# Patient Record
Sex: Female | Born: 1955 | Marital: Married | Smoking: Never smoker
Health system: Southern US, Community
[De-identification: ages and names within clinical notes are randomized; demographics above are authoritative.]

## PROBLEM LIST (undated history)

## (undated) DIAGNOSIS — I10 Essential (primary) hypertension: Secondary | ICD-10-CM

## (undated) DIAGNOSIS — E119 Type 2 diabetes mellitus without complications: Secondary | ICD-10-CM

## (undated) HISTORY — PX: COLONOSCOPY: SHX174

---

## 2019-05-24 ENCOUNTER — Emergency Department (HOSPITAL_COMMUNITY): Payer: No Typology Code available for payment source

## 2019-05-24 ENCOUNTER — Encounter (HOSPITAL_COMMUNITY)
Admission: EM | Disposition: A | Discharge status: Home / Self Care | Payer: Self-pay | Source: Home / Self Care | Attending: Emergency Medicine

## 2019-05-24 ENCOUNTER — Emergency Department (HOSPITAL_COMMUNITY): Payer: No Typology Code available for payment source | Admitting: Certified Registered Nurse Anesthetist

## 2019-05-24 ENCOUNTER — Encounter (HOSPITAL_COMMUNITY): Payer: Self-pay | Admitting: Emergency Medicine

## 2019-05-24 ENCOUNTER — Other Ambulatory Visit: Payer: Self-pay

## 2019-05-24 ENCOUNTER — Observation Stay (HOSPITAL_COMMUNITY)
Admission: EM | Admit: 2019-05-24 | Discharge: 2019-05-25 | Disposition: A | Discharge status: Home / Self Care | Payer: No Typology Code available for payment source | Attending: Urology | Admitting: Urology

## 2019-05-24 DIAGNOSIS — N200 Calculus of kidney: Secondary | ICD-10-CM

## 2019-05-24 DIAGNOSIS — N135 Crossing vessel and stricture of ureter without hydronephrosis: Secondary | ICD-10-CM | POA: Diagnosis present

## 2019-05-24 DIAGNOSIS — Z90722 Acquired absence of ovaries, bilateral: Secondary | ICD-10-CM | POA: Diagnosis not present

## 2019-05-24 DIAGNOSIS — E119 Type 2 diabetes mellitus without complications: Secondary | ICD-10-CM | POA: Diagnosis not present

## 2019-05-24 DIAGNOSIS — N201 Calculus of ureter: Principal | ICD-10-CM | POA: Insufficient documentation

## 2019-05-24 DIAGNOSIS — I700 Atherosclerosis of aorta: Secondary | ICD-10-CM | POA: Insufficient documentation

## 2019-05-24 DIAGNOSIS — Z20822 Contact with and (suspected) exposure to covid-19: Secondary | ICD-10-CM | POA: Diagnosis not present

## 2019-05-24 DIAGNOSIS — N136 Pyonephrosis: Secondary | ICD-10-CM | POA: Insufficient documentation

## 2019-05-24 DIAGNOSIS — N12 Tubulo-interstitial nephritis, not specified as acute or chronic: Secondary | ICD-10-CM | POA: Diagnosis present

## 2019-05-24 HISTORY — PX: CYSTOSCOPY WITH RETROGRADE PYELOGRAM, URETEROSCOPY AND STENT PLACEMENT: SHX5789

## 2019-05-24 HISTORY — DX: Type 2 diabetes mellitus without complications: E11.9

## 2019-05-24 LAB — CBC
HCT: 41 % (ref 36.0–46.0)
Hemoglobin: 13.5 g/dL (ref 12.0–15.0)
MCH: 28.5 pg (ref 26.0–34.0)
MCHC: 32.9 g/dL (ref 30.0–36.0)
MCV: 86.7 fL (ref 80.0–100.0)
Platelets: 308 10*3/uL (ref 150–400)
RBC: 4.73 MIL/uL (ref 3.87–5.11)
RDW: 13.7 % (ref 11.5–15.5)
WBC: 12 10*3/uL — ABNORMAL HIGH (ref 4.0–10.5)
nRBC: 0 % (ref 0.0–0.2)

## 2019-05-24 LAB — URINALYSIS, ROUTINE W REFLEX MICROSCOPIC
Bilirubin Urine: NEGATIVE
Glucose, UA: NEGATIVE mg/dL
Hgb urine dipstick: NEGATIVE
Ketones, ur: 5 mg/dL — AB
Nitrite: POSITIVE — AB
Protein, ur: NEGATIVE mg/dL
Specific Gravity, Urine: 1.012 (ref 1.005–1.030)
WBC, UA: >50 WBC/hpf — ABNORMAL HIGH (ref 0–5)
pH: 5 (ref 5.0–8.0)

## 2019-05-24 LAB — COMPREHENSIVE METABOLIC PANEL
ALT: 30 U/L (ref 0–44)
AST: 19 U/L (ref 15–41)
Albumin: 3.2 g/dL — ABNORMAL LOW (ref 3.5–5.0)
Alkaline Phosphatase: 63 U/L (ref 38–126)
Anion gap: 13 (ref 5–15)
BUN: 20 mg/dL (ref 8–23)
CO2: 21 mmol/L — ABNORMAL LOW (ref 22–32)
Calcium: 9.2 mg/dL (ref 8.9–10.3)
Chloride: 102 mmol/L (ref 98–111)
Creatinine, Ser: 0.86 mg/dL (ref 0.44–1.00)
GFR calc Af Amer: >60 mL/min (ref >60)
GFR calc non Af Amer: >60 mL/min (ref >60)
Glucose, Bld: 140 mg/dL — ABNORMAL HIGH (ref 70–99)
Potassium: 3.8 mmol/L (ref 3.5–5.1)
Sodium: 136 mmol/L (ref 135–145)
Total Bilirubin: 1.1 mg/dL (ref 0.3–1.2)
Total Protein: 7.5 g/dL (ref 6.5–8.1)

## 2019-05-24 LAB — RESPIRATORY PANEL BY RT PCR (FLU A&B, COVID)
Influenza A by PCR: NEGATIVE
Influenza B by PCR: NEGATIVE
SARS Coronavirus 2 by RT PCR: NEGATIVE

## 2019-05-24 LAB — GLUCOSE, CAPILLARY
Glucose-Capillary: 85 mg/dL (ref 70–99)
Glucose-Capillary: 102 mg/dL — ABNORMAL HIGH (ref 70–99)

## 2019-05-24 LAB — LIPASE, BLOOD: Lipase: 25 U/L (ref 11–51)

## 2019-05-24 SURGERY — CYSTOURETEROSCOPY, WITH RETROGRADE PYELOGRAM AND STENT INSERTION
Anesthesia: General | Site: Ureter | Laterality: Bilateral

## 2019-05-24 MED ORDER — SODIUM CHLORIDE 0.9 % IR SOLN
Status: DC | PRN
  Administered 2019-05-24: 3000 mL

## 2019-05-24 MED ORDER — IOHEXOL 300 MG/ML  SOLN
INTRAMUSCULAR | Status: DC | PRN
  Administered 2019-05-24: 50 mL

## 2019-05-24 MED ORDER — DEXAMETHASONE SODIUM PHOSPHATE 10 MG/ML IJ SOLN
INTRAMUSCULAR | Status: AC
  Filled 2019-05-24: qty 1

## 2019-05-24 MED ORDER — ZOLPIDEM TARTRATE 5 MG PO TABS: 5.0000 mg | ORAL_TABLET | Freq: Every evening | ORAL | Status: DC | PRN

## 2019-05-24 MED ORDER — SODIUM CHLORIDE 0.9 % IV SOLN: INTRAVENOUS | Status: DC

## 2019-05-24 MED ORDER — SODIUM CHLORIDE 0.9 % IV SOLN
1.0000 g | Freq: Once | INTRAVENOUS | Status: AC
  Administered 2019-05-24: 1 g via INTRAVENOUS
  Filled 2019-05-24: qty 10

## 2019-05-24 MED ORDER — PROPOFOL 10 MG/ML IV BOLUS
INTRAVENOUS | Status: DC | PRN
  Administered 2019-05-24: 150 mg via INTRAVENOUS

## 2019-05-24 MED ORDER — ONDANSETRON HCL 4 MG/2ML IJ SOLN: 4.0000 mg | INTRAMUSCULAR | Status: DC | PRN

## 2019-05-24 MED ORDER — FENTANYL CITRATE (PF) 100 MCG/2ML IJ SOLN: 25.0000 ug | INTRAMUSCULAR | Status: DC | PRN

## 2019-05-24 MED ORDER — ACETAMINOPHEN 325 MG PO TABS: 650.0000 mg | ORAL_TABLET | ORAL | Status: DC | PRN

## 2019-05-24 MED ORDER — MIDAZOLAM HCL 5 MG/5ML IJ SOLN
INTRAMUSCULAR | Status: DC | PRN
  Administered 2019-05-24: 2 mg via INTRAVENOUS

## 2019-05-24 MED ORDER — MIDAZOLAM HCL 2 MG/2ML IJ SOLN
INTRAMUSCULAR | Status: AC
  Filled 2019-05-24: qty 2

## 2019-05-24 MED ORDER — DEXAMETHASONE SODIUM PHOSPHATE 10 MG/ML IJ SOLN
INTRAMUSCULAR | Status: DC | PRN
  Administered 2019-05-24: 4 mg via INTRAVENOUS

## 2019-05-24 MED ORDER — DIPHENHYDRAMINE HCL 50 MG/ML IJ SOLN
INTRAMUSCULAR | Status: DC | PRN
  Administered 2019-05-24: 12.5 mg via INTRAVENOUS

## 2019-05-24 MED ORDER — IOHEXOL 300 MG/ML  SOLN
100.0000 mL | Freq: Once | INTRAMUSCULAR | Status: AC | PRN
  Administered 2019-05-24: 100 mL via INTRAVENOUS

## 2019-05-24 MED ORDER — SODIUM CHLORIDE 0.9 % IV BOLUS
500.0000 mL | Freq: Once | INTRAVENOUS | Status: AC
  Administered 2019-05-24: 500 mL via INTRAVENOUS

## 2019-05-24 MED ORDER — PROPOFOL 10 MG/ML IV BOLUS
INTRAVENOUS | Status: AC
  Filled 2019-05-24: qty 20

## 2019-05-24 MED ORDER — ONDANSETRON HCL 4 MG/2ML IJ SOLN
INTRAMUSCULAR | Status: DC | PRN
  Administered 2019-05-24: 4 mg via INTRAVENOUS

## 2019-05-24 MED ORDER — DIPHENHYDRAMINE HCL 50 MG/ML IJ SOLN: 12.5000 mg | Freq: Four times a day (QID) | INTRAMUSCULAR | Status: DC | PRN

## 2019-05-24 MED ORDER — LIDOCAINE 2% (20 MG/ML) 5 ML SYRINGE
INTRAMUSCULAR | Status: DC | PRN
  Administered 2019-05-24: 80 mg via INTRAVENOUS

## 2019-05-24 MED ORDER — ACETAMINOPHEN 500 MG PO TABS
1000.0000 mg | ORAL_TABLET | Freq: Once | ORAL | Status: AC
  Administered 2019-05-24: 1000 mg via ORAL

## 2019-05-24 MED ORDER — INSULIN ASPART 100 UNIT/ML ~~LOC~~ SOLN
0.0000 [IU] | Freq: Three times a day (TID) | SUBCUTANEOUS | Status: DC
  Administered 2019-05-25: 14:00:00 11 [IU] via SUBCUTANEOUS
  Administered 2019-05-25: 8 [IU] via SUBCUTANEOUS

## 2019-05-24 MED ORDER — INSULIN ASPART 100 UNIT/ML ~~LOC~~ SOLN
4.0000 [IU] | Freq: Three times a day (TID) | SUBCUTANEOUS | Status: DC
  Administered 2019-05-25 (×2): 4 [IU] via SUBCUTANEOUS

## 2019-05-24 MED ORDER — SODIUM CHLORIDE 0.9% FLUSH
3.0000 mL | Freq: Once | INTRAVENOUS | Status: AC
  Administered 2019-05-24: 11:00:00 3 mL via INTRAVENOUS

## 2019-05-24 MED ORDER — PROMETHAZINE HCL 25 MG/ML IJ SOLN: 6.2500 mg | INTRAMUSCULAR | Status: DC | PRN

## 2019-05-24 MED ORDER — HYDROMORPHONE HCL 1 MG/ML IJ SOLN: 0.5000 mg | INTRAMUSCULAR | Status: DC | PRN

## 2019-05-24 MED ORDER — OXYCODONE-ACETAMINOPHEN 5-325 MG PO TABS
1.0000 | ORAL_TABLET | ORAL | Status: DC | PRN
  Administered 2019-05-25 (×2): 1 via ORAL
  Filled 2019-05-24 (×2): qty 1

## 2019-05-24 MED ORDER — SODIUM CHLORIDE 0.9 % IR SOLN
Status: DC | PRN
  Administered 2019-05-24: 1000 mL

## 2019-05-24 MED ORDER — FENTANYL CITRATE (PF) 100 MCG/2ML IJ SOLN
INTRAMUSCULAR | Status: AC
  Filled 2019-05-24: qty 2

## 2019-05-24 MED ORDER — LIDOCAINE 2% (20 MG/ML) 5 ML SYRINGE
INTRAMUSCULAR | Status: AC
  Filled 2019-05-24: qty 5

## 2019-05-24 MED ORDER — LACTATED RINGERS IV SOLN: INTRAVENOUS | Status: DC | PRN

## 2019-05-24 MED ORDER — SODIUM CHLORIDE 0.9 % IV SOLN
1.0000 g | INTRAVENOUS | Status: DC
  Administered 2019-05-25: 1 g via INTRAVENOUS
  Filled 2019-05-24: qty 1

## 2019-05-24 MED ORDER — DIPHENHYDRAMINE HCL 12.5 MG/5ML PO ELIX
12.5000 mg | ORAL_SOLUTION | Freq: Four times a day (QID) | ORAL | Status: DC | PRN
  Administered 2019-05-25: 12.5 mg via ORAL
  Filled 2019-05-24: qty 5

## 2019-05-24 MED ORDER — ONDANSETRON HCL 4 MG/2ML IJ SOLN
INTRAMUSCULAR | Status: AC
  Filled 2019-05-24: qty 2

## 2019-05-24 MED ORDER — DIPHENHYDRAMINE HCL 50 MG/ML IJ SOLN
INTRAMUSCULAR | Status: AC
  Filled 2019-05-24: qty 1

## 2019-05-24 MED ORDER — HYOSCYAMINE SULFATE 0.125 MG SL SUBL
0.1250 mg | SUBLINGUAL_TABLET | SUBLINGUAL | Status: DC | PRN
  Filled 2019-05-24: qty 1

## 2019-05-24 MED ORDER — INSULIN ASPART 100 UNIT/ML ~~LOC~~ SOLN
0.0000 [IU] | Freq: Every day | SUBCUTANEOUS | Status: DC
  Administered 2019-05-25: 4 [IU] via SUBCUTANEOUS

## 2019-05-24 MED ORDER — FENTANYL CITRATE (PF) 100 MCG/2ML IJ SOLN
INTRAMUSCULAR | Status: DC | PRN
  Administered 2019-05-24 (×2): 50 ug via INTRAVENOUS

## 2019-05-24 SURGICAL SUPPLY — 21 items
BAG URO CATCHER STRL LF (MISCELLANEOUS) ×3 IMPLANT
CATH INTERMIT  6FR 70CM (CATHETERS) ×3 IMPLANT
CLOTH BEACON ORANGE TIMEOUT ST (SAFETY) ×3 IMPLANT
EXTRACTOR STONE NITINOL NGAGE (UROLOGICAL SUPPLIES) IMPLANT
FIBER LASER TRAC TIP (UROLOGICAL SUPPLIES) IMPLANT
GLOVE BIO SURGEON STRL SZ8 (GLOVE) ×3 IMPLANT
GOWN STRL REUS W/TWL XL LVL3 (GOWN DISPOSABLE) ×3 IMPLANT
GUIDEWIRE ANG ZIPWIRE 038X150 (WIRE) IMPLANT
GUIDEWIRE STR DUAL SENSOR (WIRE) ×3 IMPLANT
IV NS 1000ML (IV SOLUTION) ×2
IV NS 1000ML BAXH (IV SOLUTION) ×1 IMPLANT
KIT TURNOVER KIT A (KITS) IMPLANT
MANIFOLD NEPTUNE II (INSTRUMENTS) ×3 IMPLANT
PACK CYSTO (CUSTOM PROCEDURE TRAY) ×3 IMPLANT
SHEATH URETERAL 12FRX35CM (MISCELLANEOUS) IMPLANT
STENT URET 6FRX24 CONTOUR (STENTS) ×6 IMPLANT
STENT URET 6FRX26 CONTOUR (STENTS) IMPLANT
TUBE FEEDING 8FR 16IN STR KANG (MISCELLANEOUS) IMPLANT
TUBING CONNECTING 10 (TUBING) ×2 IMPLANT
TUBING CONNECTING 10' (TUBING) ×1
TUBING UROLOGY SET (TUBING) IMPLANT

## 2019-05-24 NOTE — Anesthesia Procedure Notes (Signed)
Procedure Name: LMA Insertion Date/Time: 05/24/2019 4:05 PM Performed by: Epimenio Sarin, CRNA Pre-anesthesia Checklist: Patient identified, Emergency Drugs available, Suction available, Patient being monitored and Timeout performed Patient Re-evaluated:Patient Re-evaluated prior to induction Oxygen Delivery Method: Circle system utilized Preoxygenation: Pre-oxygenation with 100% oxygen Induction Type: IV induction LMA: LMA with gastric port inserted LMA Size: 4.0 Number of attempts: 1 Dental Injury: Teeth and Oropharynx as per pre-operative assessment

## 2019-05-24 NOTE — ED Notes (Signed)
Per ED Provider, Pt to remain strictly NPO

## 2019-05-24 NOTE — Transfer of Care (Signed)
Immediate Anesthesia Transfer of Care Note  Patient: Yolanda Olsen  Procedure(s) Performed: CYSTOSCOPY WITH RETROGRADE PYELOGRAM,  AND STENT PLACEMENT (Bilateral Ureter)  Patient Location: PACU  Anesthesia Type:General  Level of Consciousness: awake  Airway & Oxygen Therapy: Patient Spontanous Breathing and Patient connected to face mask oxygen  Post-op Assessment: Report given to RN and Post -op Vital signs reviewed and stable  Post vital signs: Reviewed and stable  Last Vitals:  Vitals Value Taken Time  BP 125/73 05/24/19 1649  Temp    Pulse 103 05/24/19 1650  Resp 15 05/24/19 1650  SpO2 100 % 05/24/19 1650  Vitals shown include unvalidated device data.  Last Pain:  Vitals:   05/24/19 1127  TempSrc:   PainSc: 8          Complications: No apparent anesthesia complications

## 2019-05-24 NOTE — ED Triage Notes (Signed)
C/o LLQ and L side pain since Monday.  Denies nausea, vomiting, diarrhea, or urinary complaints.

## 2019-05-24 NOTE — H&P (Signed)
Urology H and P  HPI: Yolanda Olsen is a 64 year old female with history of well-controlled diabetes who presented to the ED with left-sided abdominal pain.  Pain been present for 6 days.  Upon evaluation she was afebrile and slightly tachycardic to 109.  Normotensive.  CT showed bilateral ureteral obstruction with hydronephrosis.  4 mm stone on the left, 2 mm stone on the right.  Stranding consistent with pyelonephritis.  UA showed positive leukocyte esterase, positive nitrite, few bacteria.  Greater than 50 WBC.  Lab work with leukocytosis to 12, normal creatinine at 0.86.  No metabolic abnormalities. Covid test was performed and is pending.  Current symptoms include left > right flank pain   She has one prior epidosde of nephrolithiasis that passed spontaneously. She does not have a urologist. She is not on anticoagulation.   Past Medical History: Past Medical History:  Diagnosis Date  . Diabetes mellitus without complication [facility])     Past Surgical History:  History reviewed. No pertinent surgical history.  Medication: Current Facility-Administered Medications  Medication Dose Route Frequency Provider Last Rate Last Admin  . cefTRIAXone (ROCEPHIN) 1 g in sodium chloride 0.9 % 100 mL IVPB  1 g Intravenous Once Benjiman Core, MD 200 mL/hr at 05/24/19 1303 1 g at 05/24/19 1303   No current outpatient medications on file.    Allergies: Not on File  Social History: Social History   Tobacco Use  . Smoking status: Never Smoker  . Smokeless tobacco: Never Used  Substance Use Topics  . Alcohol use: Not Currently  . Drug use: Never    Family History No family history on file.  Review of Systems 10 systems were reviewed and are negative except as noted specifically in the HPI.  Objective   Vital signs in last 24 hours: BP 140/80   Pulse 93   Temp 97.9 F (36.6 C) (Oral)   Resp 18   Ht 5\' 2"  (1.575 m)   Wt 61.2 kg   SpO2 95%   BMI 24.69 kg/m   Physical  Exam General Appearance:  No acute distress. Alert and oriented x 3. Pulmonary: Normal respiratory effort on room air Cardiovascular: Regular rate Abdomen: Soft, non-tender, without masses. Musculoskeletal: Normal gait. Extremities without edema. GU: Minimal left CVA tenderness Neurologic:  No motor abnormalities noted.    Most Recent Labs: Lab Results  Component Value Date   WBC 12.0 (H) 05/24/2019   HGB 13.5 05/24/2019   HCT 41.0 05/24/2019   PLT 308 05/24/2019    Lab Results  Component Value Date   NA 136 05/24/2019   K 3.8 05/24/2019   CL 102 05/24/2019   CO2 21 (L) 05/24/2019   BUN 20 05/24/2019   CREATININE 0.86 05/24/2019   CALCIUM 9.2 05/24/2019    No results found for: INR, APTT   IMAGING: CT ABDOMEN PELVIS W CONTRAST  Result Date: 05/24/2019 CLINICAL DATA:  Left lower quadrant pain for 6 days. Elevated white blood cell count. EXAM: CT ABDOMEN AND PELVIS WITH CONTRAST TECHNIQUE: Multidetector CT imaging of the abdomen and pelvis was performed using the standard protocol following bolus administration of intravenous contrast. CONTRAST:  100 mL OMNIPAQUE IOHEXOL 300 MG/ML  SOLN COMPARISON:  None. FINDINGS: Lower chest: Mild dependent atelectasis. No pleural or pericardial effusion. Hepatobiliary: No focal liver abnormality is seen. No gallstones, gallbladder wall thickening, or biliary dilatation. Pancreas: Unremarkable. No pancreatic ductal dilatation or surrounding inflammatory changes. Spleen: Normal in size without focal abnormality. Adrenals/Urinary Tract: The adrenal glands appear  normal. The patient has moderate to moderately severe bilateral hydronephrosis which is worse on the left. There is a distal right ureteral stone measuring 0.4 cm on image 66 and a distal left ureteral stone on image 68 which measures 0.2 cm. Patchy areas decreased cortical enhancement on delayed imaging are seen in both kidneys, much more extensive on the left, compatible with  pyelonephritis. The patient has 2 small right and a single small left renal cyst. Urinary bladder is unremarkable. Stomach/Bowel: Stomach is within normal limits. Appendix appears normal. No evidence of bowel wall thickening, distention, or inflammatory changes. Vascular/Lymphatic: Aortic atherosclerosis. No enlarged abdominal or pelvic lymph nodes. Reproductive: Status post hysterectomy. No adnexal masses. Other: None. Musculoskeletal: No acute or focal bony abnormality. Lower lumbar degenerative change noted. IMPRESSION: Moderately to moderately severe bilateral hydronephrosis due to distal ureteral stones is worse on the left. Left ureteral stone measures 0.2 cm and right renal stone measures 0.4 cm. Multifocal areas of decreased cortical enhancement the kidneys compatible with pyelonephritis are much more extensive in the left kidney. No abscess. Atherosclerosis. Electronically Signed   By: Inge Rise M.D.   On: 05/24/2019 12:20    ------  Assessment:  65 y.o. female with bilateral ureteral stones and evidence of infection.  Due to both of these factors she needs urgent ureteral stent placement.  Discussed risks of stent placement which include temporary worsening of symptoms, stent discomfort, need for second surgery, hematuria.  Alternatives would be no stent placement but this puts her at risk of infection and AKI.  Recommendations: -Bilateral ureteral stent placement -Ceftriaxone in the ED, will continue -Urine culture pending -Admit to urology at [facility] postoperatively

## 2019-05-24 NOTE — Anesthesia Preprocedure Evaluation (Signed)
Anesthesia Evaluation  Patient identified by MRN, date of birth, ID band Patient awake    Reviewed: Allergy & Precautions, NPO status , Patient's Chart, lab work & pertinent test results  Airway Mallampati: II  TM Distance: >3 FB Neck ROM: Full    Dental  (+) Teeth Intact, Dental Advisory Given   Pulmonary neg pulmonary ROS,    Pulmonary exam normal breath sounds clear to auscultation       Cardiovascular negative cardio ROS   Rhythm:Regular Rate:Tachycardia     Neuro/Psych negative neurological ROS     GI/Hepatic negative GI ROS, Neg liver ROS,   Endo/Other  diabetes, Type 2  Renal/GU bilateral ureteral obstruction     Musculoskeletal negative musculoskeletal ROS (+)   Abdominal   Peds  Hematology negative hematology ROS (+)   Anesthesia Other Findings Day of surgery medications reviewed with the patient.  Reproductive/Obstetrics                             Anesthesia Physical Anesthesia Plan  ASA: II and emergent  Anesthesia Plan: General   Post-op Pain Management:    Induction: Intravenous  PONV Risk Score and Plan: 4 or greater and Midazolam, Dexamethasone, Ondansetron and Diphenhydramine  Airway Management Planned: Oral ETT  Additional Equipment:   Intra-op Plan:   Post-operative Plan: Extubation in OR  Informed Consent: I have reviewed the patients History and Physical, chart, labs and discussed the procedure including the risks, benefits and alternatives for the proposed anesthesia with the patient or authorized representative who has indicated his/her understanding and acceptance.     Dental advisory given  Plan Discussed with: CRNA  Anesthesia Plan Comments:         Anesthesia Quick Evaluation

## 2019-05-24 NOTE — Anesthesia Postprocedure Evaluation (Signed)
Anesthesia Post Note  Patient: Chief Financial Officer  Procedure(s) Performed: CYSTOSCOPY WITH RETROGRADE PYELOGRAM,  AND STENT PLACEMENT (Bilateral Ureter)     Patient location during evaluation: PACU Anesthesia Type: General Level of consciousness: awake and alert Pain management: pain level controlled Vital Signs Assessment: post-procedure vital signs reviewed and stable Respiratory status: spontaneous breathing, nonlabored ventilation and respiratory function stable Cardiovascular status: blood pressure returned to baseline and stable Postop Assessment: no apparent nausea or vomiting Anesthetic complications: no    Last Vitals:  Vitals:   05/24/19 1745 05/24/19 1800  BP: 107/65 104/74  Pulse: 86 85  Resp: 18 20  Temp:  37.1 C  SpO2: 93% 93%    Last Pain:  Vitals:   05/24/19 1800  TempSrc:   PainSc: 0-No pain                 Cecile Hearing

## 2019-05-24 NOTE — Op Note (Addendum)
Preoperative diagnosis: bilateral ureteral calculi  Postoperative diagnosis: Same  Procedure: 1 cystoscopy 2. Bilateral retrograde pyelography 3.  Intraoperative fluoroscopy, under one hour, with interpretation 4.  bilateral 6 x 26 JJ stent exchange  Attending: Wilkie Aye, MD  Resident: Federico Flake, MD  Anesthesia: General  Estimated blood loss: None  Drains: bilateral 6 x 24 JJ ureteral stent without tether  Specimens: none  Antibiotics: rocephin  Findings: bilateral ureteral stones. moderate bilateral hydronephrosis. No masses/lesions in the bladder. Ureteral orifices in normal anatomic location.  Indications: Patient is a 64 year old female with a history of bilateral ureteral stone and concern for sepsis. After discussing treatment options, they decided proceed with bilateral stent placement.  Procedure her in detail: The patient was brought to the operating room and a brief timeout was done to ensure correct patient, correct procedure, correct site.  General anesthesia was administered patient was placed in dorsal lithotomy position.  Her genitalia was then prepped and draped in usual sterile fashion.  A rigid 22 French cystoscope was passed in the urethra and the bladder.  Bladder was inspected free masses or lesions.  the ureteral orifices were in the normal orthotopic locations. a 6 french ureteral catheter was then instilled into the left ureteral orifice.  a gentle retrograde was obtained and findings noted above. We then advanced a zipwire up to the renal pelvis. We then placed a 6 x 26 double-j ureteral stent over the zip wire. We then removed the wire and good coil was noted in the the renal pelvis under fluoroscopy and the bladder under direct vision.  We then turned out attention to the right side.  a gentle retrograde was obtained and findings noted above. We then advanced a zipwire up to the renal pelvis. we then placed a 6 x 26 double-j ureteral stent over the  original zip wire.  We then removed the wire and good coil was noted in the the renal pelvis under fluoroscopy and the bladder under direct vision.  the bladder was then drained and this concluded the procedure which was well tolerated by patient.  Complications: None  Condition: Stable, extubated, transferred to PACU  Plan: Patient is to be admitted for IV antibiotics

## 2019-05-24 NOTE — ED Provider Notes (Signed)
[facility] EMERGENCY DEPARTMENT Provider Note   CSN: [id number] Arrival date & time: 05/24/19  1017     History Chief Complaint  Patient presents with  . Abdominal Pain    Yolanda Olsen is a 64 y.o. female.  HPI Patient presents with left-sided abdominal pain.  Began around 6 days ago.  States gotten worse.  States she is decreased eating because of it.  States when she eats the pain gets more intense.  No nausea vomiting diarrhea constipation.  No fevers.  Has not had pains like this before.  No sick contacts.  No vaginal bleeding or discharge.  States she has previously had her ovaries removed.    Past Medical History:  Diagnosis Date  . Diabetes mellitus without complication (HCC)     There are no problems to display for this patient.   History reviewed. No pertinent surgical history.   OB History   No obstetric history on file.     No family history on file.  Social History   Tobacco Use  . Smoking status: Never Smoker  . Smokeless tobacco: Never Used  Substance Use Topics  . Alcohol use: Not Currently  . Drug use: Never    Home Medications Prior to Admission medications   Not on File    Allergies    Patient has no allergy information on record.  Review of Systems   Review of Systems  Constitutional: Negative for appetite change.  HENT: Negative for congestion.   Respiratory: Negative for shortness of breath.   Cardiovascular: Negative for chest pain.  Gastrointestinal: Positive for abdominal pain.  Genitourinary: Positive for flank pain. Negative for dysuria.  Musculoskeletal: Negative for back pain.  Skin: Negative for rash.  Neurological: Negative for weakness.  Psychiatric/Behavioral: Negative for confusion.    Physical Exam Updated Vital Signs BP 140/75   Pulse 90   Temp 97.9 F (36.6 C) (Oral)   Resp 17   Ht 5\' 2"  (1.575 m)   Wt 61.2 kg   SpO2 97%   BMI 24.69 kg/m   Physical Exam Vitals and nursing note  reviewed.  HENT:     Head: Normocephalic.  Cardiovascular:     Rate and Rhythm: Tachycardia present.     Comments: Mild tachycardia Pulmonary:     Breath sounds: Normal breath sounds.  Abdominal:     Comments: Left mid abdominal tenderness without rebound or guarding.  No hernia palpated.  Skin:    General: Skin is warm.     Capillary Refill: Capillary refill takes less than 2 seconds.  Neurological:     Mental Status: She is alert.     Comments: Patient is awake and appropriate     ED Results / Procedures / Treatments   Labs (all labs ordered are listed, but only abnormal results are displayed) Labs Reviewed  COMPREHENSIVE METABOLIC PANEL - Abnormal; Notable for the following components:      Result Value   CO2 21 (*)    Glucose, Bld 140 (*)    Albumin 3.2 (*)    All other components within normal limits  CBC - Abnormal; Notable for the following components:   WBC 12.0 (*)    All other components within normal limits  URINALYSIS, ROUTINE W REFLEX MICROSCOPIC - Abnormal; Notable for the following components:   APPearance HAZY (*)    Ketones, ur 5 (*)    Nitrite POSITIVE (*)    Leukocytes,Ua MODERATE (*)    WBC, UA >50 (*)  Bacteria, UA FEW (*)    All other components within normal limits  RESPIRATORY PANEL BY RT PCR (FLU A&B, COVID)  URINE CULTURE  LIPASE, BLOOD    EKG None  Radiology CT ABDOMEN PELVIS W CONTRAST  Result Date: 05/24/2019 CLINICAL DATA:  Left lower quadrant pain for 6 days. Elevated white blood cell count. EXAM: CT ABDOMEN AND PELVIS WITH CONTRAST TECHNIQUE: Multidetector CT imaging of the abdomen and pelvis was performed using the standard protocol following bolus administration of intravenous contrast. CONTRAST:  100 mL OMNIPAQUE IOHEXOL 300 MG/ML  SOLN COMPARISON:  None. FINDINGS: Lower chest: Mild dependent atelectasis. No pleural or pericardial effusion. Hepatobiliary: No focal liver abnormality is seen. No gallstones, gallbladder wall  thickening, or biliary dilatation. Pancreas: Unremarkable. No pancreatic ductal dilatation or surrounding inflammatory changes. Spleen: Normal in size without focal abnormality. Adrenals/Urinary Tract: The adrenal glands appear normal. The patient has moderate to moderately severe bilateral hydronephrosis which is worse on the left. There is a distal right ureteral stone measuring 0.4 cm on image 66 and a distal left ureteral stone on image 68 which measures 0.2 cm. Patchy areas decreased cortical enhancement on delayed imaging are seen in both kidneys, much more extensive on the left, compatible with pyelonephritis. The patient has 2 small right and a single small left renal cyst. Urinary bladder is unremarkable. Stomach/Bowel: Stomach is within normal limits. Appendix appears normal. No evidence of bowel wall thickening, distention, or inflammatory changes. Vascular/Lymphatic: Aortic atherosclerosis. No enlarged abdominal or pelvic lymph nodes. Reproductive: Status post hysterectomy. No adnexal masses. Other: None. Musculoskeletal: No acute or focal bony abnormality. Lower lumbar degenerative change noted. IMPRESSION: Moderately to moderately severe bilateral hydronephrosis due to distal ureteral stones is worse on the left. Left ureteral stone measures 0.2 cm and right renal stone measures 0.4 cm. Multifocal areas of decreased cortical enhancement the kidneys compatible with pyelonephritis are much more extensive in the left kidney. No abscess. Atherosclerosis. Electronically Signed   By: Inge Rise M.D.   On: 05/24/2019 12:20    Procedures Procedures (including critical care time)  Medications Ordered in ED Medications  sodium chloride flush (NS) 0.9 % injection 3 mL (3 mLs Intravenous Given 05/24/19 1126)  sodium chloride 0.9 % bolus 500 mL (0 mLs Intravenous Stopped 05/24/19 1236)  iohexol (OMNIPAQUE) 300 MG/ML solution 100 mL (100 mLs Intravenous Contrast Given 05/24/19 1154)  cefTRIAXone  (ROCEPHIN) 1 g in sodium chloride 0.9 % 100 mL IVPB (1 g Intravenous New Bag/Given 05/24/19 1303)    ED Course  I have reviewed the triage vital signs and the nursing notes.  Pertinent labs & imaging results that were available during my care of the patient were reviewed by me and considered in my medical decision making (see chart for details).    MDM Rules/Calculators/A&P                      Patient with left flank pain.  Well-appearing but found to have bilateral hydronephrosis from kidney stones and apparent UTI with Pyelo.  Discussed with nephrology.  Will take to the [facility] at [facility].  Dr. Nicolette Bang excepting. Final Clinical Impression(s) / ED Diagnoses Final diagnoses:  Bilateral kidney stones  Pyelonephritis    Rx / DC Orders ED Discharge Orders    None       Davonna Belling, MD 05/24/19 1414

## 2019-05-24 NOTE — ED Notes (Signed)
Patient transported to CT 

## 2019-05-24 NOTE — ED Notes (Signed)
Report given to [facility]. 

## 2019-05-25 LAB — CBC
HCT: 36.7 % (ref 36.0–46.0)
Hemoglobin: 11.7 g/dL — ABNORMAL LOW (ref 12.0–15.0)
MCH: 28 pg (ref 26.0–34.0)
MCHC: 31.9 g/dL (ref 30.0–36.0)
MCV: 87.8 fL (ref 80.0–100.0)
Platelets: 292 10*3/uL (ref 150–400)
RBC: 4.18 MIL/uL (ref 3.87–5.11)
RDW: 14 % (ref 11.5–15.5)
WBC: 8 10*3/uL (ref 4.0–10.5)
nRBC: 0 % (ref 0.0–0.2)

## 2019-05-25 LAB — BASIC METABOLIC PANEL
Anion gap: 8 (ref 5–15)
BUN: 25 mg/dL — ABNORMAL HIGH (ref 8–23)
CO2: 21 mmol/L — ABNORMAL LOW (ref 22–32)
Calcium: 8.8 mg/dL — ABNORMAL LOW (ref 8.9–10.3)
Chloride: 107 mmol/L (ref 98–111)
Creatinine, Ser: 0.66 mg/dL (ref 0.44–1.00)
GFR calc Af Amer: >60 mL/min (ref >60)
GFR calc non Af Amer: >60 mL/min (ref >60)
Glucose, Bld: 277 mg/dL — ABNORMAL HIGH (ref 70–99)
Potassium: 4.4 mmol/L (ref 3.5–5.1)
Sodium: 136 mmol/L (ref 135–145)

## 2019-05-25 LAB — GLUCOSE, CAPILLARY
Glucose-Capillary: 251 mg/dL — ABNORMAL HIGH (ref 70–99)
Glucose-Capillary: 282 mg/dL — ABNORMAL HIGH (ref 70–99)
Glucose-Capillary: 296 mg/dL — ABNORMAL HIGH (ref 70–99)
Glucose-Capillary: 317 mg/dL — ABNORMAL HIGH (ref 70–99)
Glucose-Capillary: 346 mg/dL — ABNORMAL HIGH (ref 70–99)

## 2019-05-25 MED ORDER — OXYBUTYNIN CHLORIDE 5 MG PO TABS: 5.0000 mg | ORAL_TABLET | Freq: Three times a day (TID) | ORAL | Status: DC | PRN

## 2019-05-25 MED ORDER — POLYETHYLENE GLYCOL 3350 17 G PO PACK
17.0000 g | PACK | Freq: Every day | ORAL | Status: DC
  Administered 2019-05-25: 17 g via ORAL
  Filled 2019-05-25: qty 1

## 2019-05-25 MED ORDER — OXYCODONE-ACETAMINOPHEN 5-325 MG PO TABS: 1.0000 | ORAL_TABLET | ORAL | 0 refills | Status: DC | PRN

## 2019-05-25 MED ORDER — SULFAMETHOXAZOLE-TRIMETHOPRIM 800-160 MG PO TABS: 1.0000 | ORAL_TABLET | Freq: Two times a day (BID) | ORAL | 0 refills | Status: AC

## 2019-05-25 MED ORDER — TAMSULOSIN HCL 0.4 MG PO CAPS
0.4000 mg | ORAL_CAPSULE | Freq: Every day | ORAL | Status: DC
  Administered 2019-05-25: 0.4 mg via ORAL
  Filled 2019-05-25: qty 1

## 2019-05-25 MED ORDER — CHLORHEXIDINE GLUCONATE CLOTH 2 % EX PADS: 6.0000 | MEDICATED_PAD | Freq: Every day | CUTANEOUS | Status: DC

## 2019-05-25 MED ORDER — SULFAMETHOXAZOLE-TRIMETHOPRIM 800-160 MG PO TABS: 1.0000 | ORAL_TABLET | Freq: Two times a day (BID) | ORAL | 0 refills | Status: DC

## 2019-05-25 NOTE — Discharge Instructions (Signed)
1. You may see some blood in the urine and may have some burning with urination for 48-72 hours. You also may notice that you have to urinate more frequently or urgently after your procedure which is normal.  2. You should call should you develop an inability urinate, fever > 101, persistent nausea and vomiting that prevents you from eating or drinking to stay hydrated.  3. If you have a stent, you will likely urinate more frequently and urgently until the stent is removed and you may experience some discomfort/pain in the lower abdomen and flank especially when urinating. You may take pain medication prescribed to you if needed for pain. You may also intermittently have blood in the urine until the stent is removed  4. You will be scheduled for another surgery in several weeks. You will need a coronavirus test prior to the surgery. It will be outpatient surgery where you need someone to bring you to and from surgery. We will call you to arrange the surgery and the covid test  5. f you have issues you should call the office [phone]) to notify us.

## 2019-05-25 NOTE — Progress Notes (Signed)
Urology Progress Note   1 Day Post-Op s/p bilateral ureteral stent placement  Subjective: Did well overnight, no fevers, no chills.  Tolerating catheter with clear yellow urine.  White count downtrending 6, 8.0  Objective: Vital signs in last 24 hours: Temp:  [97.4 F (36.3 C)-99 F (37.2 C)] 97.4 F (36.3 C) (01/25 0541) Pulse Rate:  [55-109] 55 (01/25 0541) Resp:  [15-25] 16 (01/25 0541) BP: (103-142)/(60-94) 113/65 (01/25 0541) SpO2:  [93 %-100 %] 96 % (01/25 0541) Weight:  [61.2 kg] 61.2 kg (01/24 1024)  Intake/Output from previous day: 01/24 0701 - 01/25 0700 In: 575 [I.V.:575] Out: 1050 [Urine:1050] Intake/Output this shift: No intake/output data recorded.  Physical Exam:  General: Alert and oriented CV: RRR Lungs: Normal work of breathing Abdomen: Soft, appropriately tender.  GU: Foley in place draining clear yellow urine  Ext: NT, No erythema  Lab Results: Recent Labs    05/24/19 1035 05/25/19 0438  HGB 13.5 11.7*  HCT 41.0 36.7   BMET Recent Labs    05/24/19 1035 05/25/19 0438  NA 136 136  K 3.8 4.4  CL 102 107  CO2 21* 21*  GLUCOSE 140* 277*  BUN 20 25*  CREATININE 0.86 0.66  CALCIUM 9.2 8.8*     Studies/Results: CT ABDOMEN PELVIS W CONTRAST  Result Date: 05/24/2019 CLINICAL DATA:  Left lower quadrant pain for 6 days. Elevated white blood cell count. EXAM: CT ABDOMEN AND PELVIS WITH CONTRAST TECHNIQUE: Multidetector CT imaging of the abdomen and pelvis was performed using the standard protocol following bolus administration of intravenous contrast. CONTRAST:  100 mL OMNIPAQUE IOHEXOL 300 MG/ML  SOLN COMPARISON:  None. FINDINGS: Lower chest: Mild dependent atelectasis. No pleural or pericardial effusion. Hepatobiliary: No focal liver abnormality is seen. No gallstones, gallbladder wall thickening, or biliary dilatation. Pancreas: Unremarkable. No pancreatic ductal dilatation or surrounding inflammatory changes. Spleen: Normal in size without focal  abnormality. Adrenals/Urinary Tract: The adrenal glands appear normal. The patient has moderate to moderately severe bilateral hydronephrosis which is worse on the left. There is a distal right ureteral stone measuring 0.4 cm on image 66 and a distal left ureteral stone on image 68 which measures 0.2 cm. Patchy areas decreased cortical enhancement on delayed imaging are seen in both kidneys, much more extensive on the left, compatible with pyelonephritis. The patient has 2 small right and a single small left renal cyst. Urinary bladder is unremarkable. Stomach/Bowel: Stomach is within normal limits. Appendix appears normal. No evidence of bowel wall thickening, distention, or inflammatory changes. Vascular/Lymphatic: Aortic atherosclerosis. No enlarged abdominal or pelvic lymph nodes. Reproductive: Status post hysterectomy. No adnexal masses. Other: None. Musculoskeletal: No acute or focal bony abnormality. Lower lumbar degenerative change noted. IMPRESSION: Moderately to moderately severe bilateral hydronephrosis due to distal ureteral stones is worse on the left. Left ureteral stone measures 0.2 cm and right renal stone measures 0.4 cm. Multifocal areas of decreased cortical enhancement the kidneys compatible with pyelonephritis are much more extensive in the left kidney. No abscess. Atherosclerosis. Electronically Signed   By: Drusilla Kanner M.D.   On: 05/24/2019 12:20   DG C-Arm 1-60 Min-No Report  Result Date: 05/24/2019 Fluoroscopy was utilized by the requesting physician.  No radiographic interpretation.    Assessment/Plan:  64 y.o. female s/p bilateral ureteral stent placement for possible bilateral obstructing stones.  Her labs have normalized and she is feeling well this morning.  Pain improved..    -Discontinue Foley catheter -Trial void -Ditropan, Flomax for stent discomfort -Continue ceftriaxone  today, transition to oral Bactrim -We will follow-up for bilateral ureteroscopic stone  extraction.  Dr. Alyson Ingles to request and patient does not need to be seen in clinic prior.   Dispo: Possible home this PM   LOS: 0 days   Tharon Aquas 05/25/2019, 7:24 AM

## 2019-05-25 NOTE — Discharge Summary (Signed)
[facility] Discharge Summary  Admit date: 05/24/2019  Discharge date and time: 05/25/19   Discharge to: Home  Discharge Service: Urology  Discharge Attending Physician:  McKenzie  Discharge  Diagnoses: Ureteral stones, bilateral   Secondary Diagnosis: Active Problems:   Bilateral ureteral obstruction   OR Procedures: Procedure(s): CYSTOSCOPY WITH RETROGRADE PYELOGRAM,  AND STENT PLACEMENT 05/24/2019   Ancillary Procedures: None   Discharge Day Services: The patient was seen and examined by the Urology team both in the morning and immediately prior to discharge.  Vital signs and laboratory values were stable and within normal limits.  The physical exam was benign.  Discharge instructions were explained and all questions answered.  Subjective  No acute events overnight. Pain Controlled. No fever or chills.  Objective Patient Vitals for the past 8 hrs:  BP Temp Temp src Pulse Resp SpO2  05/25/19 1415 115/75 98.2 F (36.8 C) Oral 80 18 97 %  05/25/19 1012 110/70 98 F (36.7 C) Oral 88 18 93 %   Total I/O In: 192.9 [P.O.:120; IV Piggyback:72.9] Out: 300 [Urine:300]  General Appearance:        No acute distress Lungs:                      Normal work of breathing on room air Heart:                                Regular rate and rhythm Abdomen:                         Soft, non-tender, non-distended GU:        Voiding volitionally  Extremities:                      Warm and well perfused  Hospital Course:  64 y.o. with history of DM who presented on 05/24/19 with bilateral ureteral stones and signs of infection. The patient underwent bilateral ureteral stent placement on 05/24/2019.   The patient tolerated the procedure well, was extubated in the OR, and afterwards was taken to the PACU for routine post-surgical care. When stable the patient was transferred to the floor.     The patient did well postoperatively.  No episodes of fever. Urine culture growing GNR with  speciation and sensitivities pending. On Ceftriaxone   The patient was discharged home 1 Day Post-Op, at which point was tolerating a regular solid diet, was able to void spontaneously, have adequate pain control with P.O. pain medication, and could ambulate without difficulty.  She was discharged on Bactrim. We will follow up sensitivities. She will be scheduled for a ureteroscopic stone extraction bilaterally.     Condition at Discharge: Improved  Discharge Medications:  Allergies as of 05/25/2019      Reactions   Ceftriaxone Sodium And Nacl Itching      Medication List    TAKE these medications   lisinopril 20 MG tablet Commonly known as: ZESTRIL Take 20 mg by mouth daily.   meloxicam 15 MG tablet Commonly known as: MOBIC Take 15 mg by mouth daily as needed for pain.   metFORMIN 500 MG tablet Commonly known as: GLUCOPHAGE Take 1,000 mg by mouth every evening.   sulfamethoxazole-trimethoprim 800-160 MG tablet Commonly known as: BACTRIM DS Take 1 tablet by mouth 2 (two) times daily for 14 days.

## 2019-05-25 NOTE — Progress Notes (Signed)
Nutrition Brief Note  Patient identified on the Malnutrition Screening Tool (MST) Report  Wt Readings from Last 15 Encounters:  05/24/19 61.2 kg    Body mass index is 24.69 kg/m. Patient meets criteria for normal weight, borderline overweight based on current BMI.   Current diet order is Regular and patient ate 50% of breakfast and 100% of lunch today. Labs and medications reviewed.   Discharge order and discharge summary for discharge home entered a short time ago; patient has been OBS.   No nutrition interventions warranted at this time. If nutrition issues arise, please consult RD.      Trenton Gammon, MS, RD, LDN, [facility] Inpatient Clinical Dietitian Pager # [phone] After hours/weekend pager # [phone]

## 2019-05-25 NOTE — Progress Notes (Signed)
Called to patient's room by NT who states patient is c/o facial itching. Patient noted that her face began to itch after IV Rocephin began to infuse. Neck and chest noted to be slightly reddened and small amount of redness noted under bilateral eyes with slight edema. Pt stated she felt small bumps all over her face. RN did not visually see any raised areas to face. IV infusion stopped and IV NSL. VSS. Pt denies any difficulty breathing. PRN Bendaryl given with relief. Dr. Ronne Binning paged and made aware. Rocephin added to allergy list. Pt made aware.

## 2019-05-25 NOTE — Plan of Care (Signed)
  Problem: Education: Goal: Knowledge of General Education information will improve Description: Including pain rating scale, medication(s)/side effects and non-pharmacologic comfort measures Outcome: Completed/Met   Problem: Health Behavior/Discharge Planning: Goal: Ability to manage health-related needs will improve 05/25/2019 1756 by Annie Sable, RN Outcome: Completed/Met 05/25/2019 1056 by Annie Sable, RN Outcome: Progressing   Problem: Clinical Measurements: Goal: Ability to maintain clinical measurements within normal limits will improve Outcome: Completed/Met Goal: Will remain free from infection 05/25/2019 1756 by Annie Sable, RN Outcome: Completed/Met 05/25/2019 1056 by Annie Sable, RN Outcome: Progressing Goal: Diagnostic test results will improve Outcome: Completed/Met Goal: Respiratory complications will improve Outcome: Completed/Met Goal: Cardiovascular complication will be avoided Outcome: Completed/Met   Problem: Activity: Goal: Risk for activity intolerance will decrease 05/25/2019 1756 by Annie Sable, RN Outcome: Completed/Met 05/25/2019 1056 by Annie Sable, RN Outcome: Progressing   Problem: Nutrition: Goal: Adequate nutrition will be maintained 05/25/2019 1756 by Annie Sable, RN Outcome: Completed/Met 05/25/2019 1056 by Annie Sable, RN Outcome: Progressing   Problem: Coping: Goal: Level of anxiety will decrease Outcome: Completed/Met   Problem: Elimination: Goal: Will not experience complications related to bowel motility 05/25/2019 1756 by Annie Sable, RN Outcome: Completed/Met 05/25/2019 1056 by Annie Sable, RN Outcome: Progressing Goal: Will not experience complications related to urinary retention 05/25/2019 1756 by Annie Sable, RN Outcome: Completed/Met 05/25/2019 1056 by Annie Sable, RN Outcome: Progressing   Problem: Pain Managment: Goal: General experience of comfort will improve Outcome:  Completed/Met   Problem: Safety: Goal: Ability to remain free from injury will improve Outcome: Completed/Met   Problem: Skin Integrity: Goal: Risk for impaired skin integrity will decrease Outcome: Completed/Met

## 2019-05-25 NOTE — Progress Notes (Signed)
Pt discharged home today per Dr. Ronne Binning. Pt's IV site D/C'd and WDL. Pt's VSS. Pt provided with home medication list, discharge instructions and prescriptions. Verbalized understanding. Pt left floor via WC in stable condition accompanied by RN.

## 2019-05-25 NOTE — Plan of Care (Signed)
  Problem: Education: Goal: Knowledge of General Education information will improve Description: Including pain rating scale, medication(s)/side effects and non-pharmacologic comfort measures Outcome: Completed/Met   Problem: Health Behavior/Discharge Planning: Goal: Ability to manage health-related needs will improve Outcome: Progressing   Problem: Clinical Measurements: Goal: Ability to maintain clinical measurements within normal limits will improve Outcome: Completed/Met Goal: Will remain free from infection Outcome: Progressing Goal: Diagnostic test results will improve Outcome: Completed/Met Goal: Respiratory complications will improve Outcome: Completed/Met Goal: Cardiovascular complication will be avoided Outcome: Completed/Met   Problem: Activity: Goal: Risk for activity intolerance will decrease Outcome: Progressing   Problem: Nutrition: Goal: Adequate nutrition will be maintained Outcome: Progressing   Problem: Coping: Goal: Level of anxiety will decrease Outcome: Completed/Met   Problem: Elimination: Goal: Will not experience complications related to bowel motility Outcome: Progressing Goal: Will not experience complications related to urinary retention Outcome: Progressing   Problem: Pain Managment: Goal: General experience of comfort will improve Outcome: Completed/Met   Problem: Safety: Goal: Ability to remain free from injury will improve Outcome: Completed/Met   Problem: Skin Integrity: Goal: Risk for impaired skin integrity will decrease Outcome: Completed/Met

## 2019-05-26 LAB — URINE CULTURE: Culture: >=100000 — AB

## 2019-06-17 ENCOUNTER — Other Ambulatory Visit: Payer: Self-pay | Admitting: Urology

## 2019-06-23 ENCOUNTER — Other Ambulatory Visit (HOSPITAL_COMMUNITY)
Admission: RE | Admit: 2019-06-23 | Discharge: 2019-06-23 | Disposition: A | Discharge status: Home / Self Care | Payer: PRIVATE HEALTH INSURANCE | Source: Ambulatory Visit | Attending: Urology | Admitting: Urology

## 2019-06-23 DIAGNOSIS — Z01812 Encounter for preprocedural laboratory examination: Secondary | ICD-10-CM | POA: Diagnosis present

## 2019-06-23 DIAGNOSIS — Z20822 Contact with and (suspected) exposure to covid-19: Secondary | ICD-10-CM | POA: Insufficient documentation

## 2019-06-23 LAB — SARS CORONAVIRUS 2 (TAT 6-24 HRS): SARS Coronavirus 2: NEGATIVE

## 2019-06-23 NOTE — Patient Instructions (Signed)
DUE TO COVID-19 ONLY ONE VISITOR IS ALLOWED TO COME WITH YOU AND STAY IN THE WAITING ROOM ONLY DURING PRE OP AND PROCEDURE DAY OF SURGERY. THE 1 VISITOR MAY VISIT WITH YOU AFTER SURGERY IN YOUR PRIVATE ROOM DURING VISITING HOURS ONLY!  YOU NEED TO HAVE A COVID 19 TEST ON: 06/23/19 @ 10:00 AM, THIS TEST MUST BE DONE BEFORE SURGERY, COME  [address], [address] [place] , [address].  ([place]) ONCE YOUR COVID TEST IS COMPLETED, PLEASE BEGIN THE QUARANTINE INSTRUCTIONS AS OUTLINED IN YOUR HANDOUT.                Yolanda Olsen     Your procedure is scheduled on: 06/26/19   Report to [facility] Main  Entrance   Report to SHORT STAY at: 5:30 AM     Call this number if you have problems the morning of surgery [phone]    Remember:  Do not eat food or drink liquids :After Midnight.   BRUSH YOUR TEETH MORNING OF SURGERY AND RINSE YOUR MOUTH OUT, NO CHEWING GUM CANDY OR MINTS.   How to Manage Your Diabetes Before and After Surgery  Why is it important to control my blood sugar before and after surgery? . Improving blood sugar levels before and after surgery helps healing and can limit problems. . A way of improving blood sugar control is eating a healthy diet by: o  Eating less sugar and carbohydrates o  Increasing activity/exercise o  Talking with your doctor about reaching your blood sugar goals . High blood sugars (greater than 180 mg/dL) can raise your risk of infections and slow your recovery, so you will need to focus on controlling your diabetes during the weeks before surgery. . Make sure that the doctor who takes care of your diabetes knows about your planned surgery including the date and location.  How do I manage my blood sugar before surgery? . Check your blood sugar at least 4 times a day, starting 2 days before surgery, to make sure that the level is not too high or low. o Check your blood sugar the morning of your surgery when you wake up and  every 2 hours until you get to the Short Stay unit. . If your blood sugar is less than 70 mg/dL, you will need to treat for low blood sugar: o Do not take insulin. o Treat a low blood sugar (less than 70 mg/dL) with  cup of clear juice (cranberry or apple), 4 glucose tablets, OR glucose gel. o Recheck blood sugar in 15 minutes after treatment (to make sure it is greater than 70 mg/dL). If your blood sugar is not greater than 70 mg/dL on recheck, call [phone] for further instructions. . Report your blood sugar to the short stay nurse when you get to Short Stay.  . If you are admitted to the hospital after surgery: o Your blood sugar will be checked by the staff and you will probably be given insulin after surgery (instead of oral diabetes medicines) to make sure you have good blood sugar levels. o The goal for blood sugar control after surgery is 80-180 mg/dL.   WHAT DO I DO ABOUT MY DIABETES MEDICATION?  Marland Kitchen Do not take oral diabetes medicines (pills) the morning of surgery.  . THE DAY BEFORE SURGERY, take  METFORMIN AS USUAL.    . THE MORNING OF SURGERY, DO NOT TAKE METFORMIN..  DO NOT TAKE ANY DIABETIC MEDICATIONS DAY OF YOUR SURGERY  You may not have any metal on your body including hair pins and              piercings  Do not wear jewelry, make-up, lotions, powders or perfumes, deodorant             Do not wear nail polish on your fingernails.  Do not shave  48 hours prior to surgery.              Men may shave face and neck.   Do not bring valuables to the hospital. [facility] IS NOT             RESPONSIBLE   FOR VALUABLES.  Contacts, dentures or bridgework may not be worn into surgery.  Leave suitcase in the car. After surgery it may be brought to your room.     Patients discharged the day of surgery will not be allowed to drive home. IF YOU ARE HAVING SURGERY AND GOING HOME THE SAME DAY, YOU MUST HAVE AN ADULT TO DRIVE YOU HOME AND BE WITH  YOU FOR 24 HOURS. YOU MAY GO HOME BY TAXI OR UBER OR ORTHERWISE, BUT AN ADULT MUST ACCOMPANY YOU HOME AND STAY WITH YOU FOR 24 HOURS.  Name and phone number of your driver:  Special Instructions: N/A              Please read over the following fact sheets you were given: _____________________________________________________________________             [facility] - Preparing for Surgery Before surgery, you can play an important role.  Because skin is not sterile, your skin needs to be as free of germs as possible.  You can reduce the number of germs on your skin by washing with CHG (chlorahexidine gluconate) soap before surgery.  CHG is an antiseptic cleaner which kills germs and bonds with the skin to continue killing germs even after washing. Please DO NOT use if you have an allergy to CHG or antibacterial soaps.  If your skin becomes reddened/irritated stop using the CHG and inform your nurse when you arrive at Short Stay. Do not shave (including legs and underarms) for at least 48 hours prior to the first CHG shower.  You may shave your face/neck. Please follow these instructions carefully:  1.  Shower with CHG Soap the night before surgery and the  morning of Surgery.  2.  If you choose to wash your hair, wash your hair first as usual with your  normal  shampoo.  3.  After you shampoo, rinse your hair and body thoroughly to remove the  shampoo.                           4.  Use CHG as you would any other liquid soap.  You can apply chg directly  to the skin and wash                       Gently with a scrungie or clean washcloth.  5.  Apply the CHG Soap to your body ONLY FROM THE NECK DOWN.   Do not use on face/ open                           Wound or open sores. Avoid contact with eyes, ears mouth and genitals (private parts).  Wash face,  Genitals (private parts) with your normal soap.             6.  Wash thoroughly, paying special attention to the area where your  surgery  will be performed.  7.  Thoroughly rinse your body with warm water from the neck down.  8.  DO NOT shower/wash with your normal soap after using and rinsing off  the CHG Soap.                9.  Pat yourself dry with a clean towel.            10.  Wear clean pajamas.            11.  Place clean sheets on your bed the night of your first shower and do not  sleep with pets. Day of Surgery : Do not apply any lotions/deodorants the morning of surgery.  Please wear clean clothes to the hospital/surgery center.  FAILURE TO FOLLOW THESE INSTRUCTIONS MAY RESULT IN THE CANCELLATION OF YOUR SURGERY PATIENT SIGNATURE_________________________________  NURSE SIGNATURE__________________________________  ________________________________________________________________________

## 2019-06-24 ENCOUNTER — Encounter (HOSPITAL_COMMUNITY)
Admission: RE | Admit: 2019-06-24 | Discharge: 2019-06-24 | Disposition: A | Discharge status: Home / Self Care | Payer: PRIVATE HEALTH INSURANCE | Source: Ambulatory Visit | Attending: Urology | Admitting: Urology

## 2019-06-24 ENCOUNTER — Other Ambulatory Visit: Payer: Self-pay

## 2019-06-24 ENCOUNTER — Encounter (HOSPITAL_COMMUNITY): Payer: Self-pay

## 2019-06-24 DIAGNOSIS — Z01812 Encounter for preprocedural laboratory examination: Secondary | ICD-10-CM | POA: Insufficient documentation

## 2019-06-24 DIAGNOSIS — Z01810 Encounter for preprocedural cardiovascular examination: Secondary | ICD-10-CM | POA: Diagnosis present

## 2019-06-24 DIAGNOSIS — R9431 Abnormal electrocardiogram [ECG] [EKG]: Secondary | ICD-10-CM | POA: Diagnosis not present

## 2019-06-24 HISTORY — DX: Essential (primary) hypertension: I10

## 2019-06-24 LAB — HEMOGLOBIN A1C
Hgb A1c MFr Bld: 6.7 % — ABNORMAL HIGH (ref 4.8–5.6)
Mean Plasma Glucose: 145.59 mg/dL

## 2019-06-24 LAB — CBC
HCT: 40.1 % (ref 36.0–46.0)
Hemoglobin: 12.7 g/dL (ref 12.0–15.0)
MCH: 28.7 pg (ref 26.0–34.0)
MCHC: 31.7 g/dL (ref 30.0–36.0)
MCV: 90.5 fL (ref 80.0–100.0)
Platelets: 270 10*3/uL (ref 150–400)
RBC: 4.43 MIL/uL (ref 3.87–5.11)
RDW: 14.2 % (ref 11.5–15.5)
WBC: 6 10*3/uL (ref 4.0–10.5)
nRBC: 0 % (ref 0.0–0.2)

## 2019-06-24 LAB — BASIC METABOLIC PANEL
Anion gap: 9 (ref 5–15)
BUN: 18 mg/dL (ref 8–23)
CO2: 28 mmol/L (ref 22–32)
Calcium: 9.6 mg/dL (ref 8.9–10.3)
Chloride: 105 mmol/L (ref 98–111)
Creatinine, Ser: 0.83 mg/dL (ref 0.44–1.00)
GFR calc Af Amer: >60 mL/min (ref >60)
GFR calc non Af Amer: >60 mL/min (ref >60)
Glucose, Bld: 142 mg/dL — ABNORMAL HIGH (ref 70–99)
Potassium: 5 mmol/L (ref 3.5–5.1)
Sodium: 142 mmol/L (ref 135–145)

## 2019-06-24 NOTE — Progress Notes (Signed)
PCP -  Cardiologist -   Chest x-ray -  EKG -  Stress Test -  ECHO -  Cardiac Cath -   Sleep Study -  CPAP -   Fasting Blood Sugar - 110 Checks Blood Sugar 1 time a day  Blood Thinner Instructions: Aspirin Instructions: Last Dose:  Anesthesia review:   Patient denies shortness of breath, fever, cough and chest pain at PAT appointment   Patient verbalized understanding of instructions that were given to them at the PAT appointment. Patient was also instructed that they will need to review over the PAT instructions again at home before surgery.

## 2019-06-25 NOTE — Anesthesia Preprocedure Evaluation (Addendum)
Anesthesia Evaluation  Patient identified by MRN, date of birth, ID band Patient awake    Reviewed: Allergy & Precautions, NPO status , Patient's Chart, lab work & pertinent test results  History of Anesthesia Complications Negative for: history of anesthetic complications  Airway Mallampati: II  TM Distance: >3 FB Neck ROM: Full    Dental no notable dental hx. (+) Dental Advisory Given   Pulmonary neg pulmonary ROS,    Pulmonary exam normal        Cardiovascular hypertension, Normal cardiovascular exam     Neuro/Psych negative neurological ROS     GI/Hepatic negative GI ROS, Neg liver ROS,   Endo/Other  diabetes, Type 2  Renal/GU bilateral ureteral calculi     Musculoskeletal negative musculoskeletal ROS (+)   Abdominal   Peds  Hematology negative hematology ROS (+)   Anesthesia Other Findings Day of surgery medications reviewed with the patient.  Reproductive/Obstetrics                            Anesthesia Physical  Anesthesia Plan  ASA: II  Anesthesia Plan: General   Post-op Pain Management:    Induction: Intravenous  PONV Risk Score and Plan: 4 or greater and Midazolam, Dexamethasone, Ondansetron and Diphenhydramine  Airway Management Planned: LMA  Additional Equipment:   Intra-op Plan:   Post-operative Plan: Extubation in OR  Informed Consent: I have reviewed the patients History and Physical, chart, labs and discussed the procedure including the risks, benefits and alternatives for the proposed anesthesia with the patient or authorized representative who has indicated his/her understanding and acceptance.     Dental advisory given  Plan Discussed with: CRNA and Anesthesiologist  Anesthesia Plan Comments:        Anesthesia Quick Evaluation

## 2019-06-25 NOTE — Progress Notes (Signed)
Called and spoke w/ pt via phone to inform her that surgery has been moved from [facility] main hospital to [facility].  Pt verbalized understanding to arrive at 0530 to [facility] (directions given) and be npo after mn and do not take any diabetic medication morning of surgery.   Pt had CBC,BMP,A1c, EKG done 06-24-2019 and negative covid test 06-23-2019 results in epic.

## 2019-06-26 ENCOUNTER — Ambulatory Visit (HOSPITAL_BASED_OUTPATIENT_CLINIC_OR_DEPARTMENT_OTHER): Payer: PRIVATE HEALTH INSURANCE | Admitting: Physician Assistant

## 2019-06-26 ENCOUNTER — Ambulatory Visit (HOSPITAL_BASED_OUTPATIENT_CLINIC_OR_DEPARTMENT_OTHER): Payer: PRIVATE HEALTH INSURANCE | Admitting: Anesthesiology

## 2019-06-26 ENCOUNTER — Encounter (HOSPITAL_BASED_OUTPATIENT_CLINIC_OR_DEPARTMENT_OTHER)
Admission: RE | Disposition: A | Discharge status: Home / Self Care | Payer: Self-pay | Source: Home / Self Care | Attending: Urology

## 2019-06-26 ENCOUNTER — Ambulatory Visit (HOSPITAL_BASED_OUTPATIENT_CLINIC_OR_DEPARTMENT_OTHER)
Admission: RE | Admit: 2019-06-26 | Discharge: 2019-06-26 | Disposition: A | Discharge status: Home / Self Care | Payer: PRIVATE HEALTH INSURANCE | Attending: Urology | Admitting: Urology

## 2019-06-26 ENCOUNTER — Encounter (HOSPITAL_BASED_OUTPATIENT_CLINIC_OR_DEPARTMENT_OTHER): Payer: Self-pay | Admitting: Urology

## 2019-06-26 DIAGNOSIS — Z79899 Other long term (current) drug therapy: Secondary | ICD-10-CM | POA: Insufficient documentation

## 2019-06-26 DIAGNOSIS — I10 Essential (primary) hypertension: Secondary | ICD-10-CM | POA: Diagnosis not present

## 2019-06-26 DIAGNOSIS — N201 Calculus of ureter: Secondary | ICD-10-CM | POA: Diagnosis present

## 2019-06-26 DIAGNOSIS — Z888 Allergy status to other drugs, medicaments and biological substances status: Secondary | ICD-10-CM | POA: Diagnosis not present

## 2019-06-26 DIAGNOSIS — E118 Type 2 diabetes mellitus with unspecified complications: Secondary | ICD-10-CM | POA: Diagnosis not present

## 2019-06-26 HISTORY — PX: CYSTOSCOPY WITH RETROGRADE PYELOGRAM, URETEROSCOPY AND STENT PLACEMENT: SHX5789

## 2019-06-26 LAB — POCT I-STAT, CHEM 8
BUN: 13 mg/dL (ref 8–23)
Calcium, Ion: 1.25 mmol/L (ref 1.15–1.40)
Chloride: 95 mmol/L — ABNORMAL LOW (ref 98–111)
Creatinine, Ser: 0.6 mg/dL (ref 0.44–1.00)
Glucose, Bld: 88 mg/dL (ref 70–99)
HCT: 43 % (ref 36.0–46.0)
Hemoglobin: 14.6 g/dL (ref 12.0–15.0)
Potassium: 4.1 mmol/L (ref 3.5–5.1)
Sodium: 134 mmol/L — ABNORMAL LOW (ref 135–145)
TCO2: 29 mmol/L (ref 22–32)

## 2019-06-26 LAB — GLUCOSE, CAPILLARY
Glucose-Capillary: 141 mg/dL — ABNORMAL HIGH (ref 70–99)
Glucose-Capillary: 136 mg/dL — ABNORMAL HIGH (ref 70–99)

## 2019-06-26 SURGERY — CYSTOURETEROSCOPY, WITH RETROGRADE PYELOGRAM AND STENT INSERTION
Anesthesia: General | Site: Ureter | Laterality: Bilateral

## 2019-06-26 MED ORDER — ACETAMINOPHEN 500 MG PO TABS
1000.0000 mg | ORAL_TABLET | Freq: Once | ORAL | Status: AC
  Administered 2019-06-26: 1000 mg via ORAL
  Filled 2019-06-26: qty 2

## 2019-06-26 MED ORDER — OXYCODONE HCL 5 MG PO TABS
ORAL_TABLET | ORAL | Status: AC
  Filled 2019-06-26: qty 1

## 2019-06-26 MED ORDER — ONDANSETRON HCL 4 MG/2ML IJ SOLN
INTRAMUSCULAR | Status: AC
  Filled 2019-06-26: qty 2

## 2019-06-26 MED ORDER — MIDAZOLAM HCL 2 MG/2ML IJ SOLN
INTRAMUSCULAR | Status: AC
  Filled 2019-06-26: qty 2

## 2019-06-26 MED ORDER — CEFTRIAXONE SODIUM 2 G IJ SOLR
INTRAMUSCULAR | Status: AC
  Filled 2019-06-26: qty 20

## 2019-06-26 MED ORDER — LACTATED RINGERS IV SOLN
INTRAVENOUS | Status: DC
  Filled 2019-06-26: qty 1000

## 2019-06-26 MED ORDER — ONDANSETRON HCL 4 MG/2ML IJ SOLN
INTRAMUSCULAR | Status: DC | PRN
  Administered 2019-06-26: 4 mg via INTRAVENOUS

## 2019-06-26 MED ORDER — PROPOFOL 10 MG/ML IV BOLUS
INTRAVENOUS | Status: DC | PRN
  Administered 2019-06-26: 200 mg via INTRAVENOUS

## 2019-06-26 MED ORDER — IOHEXOL 300 MG/ML  SOLN
INTRAMUSCULAR | Status: DC | PRN
  Administered 2019-06-26: 08:00:00 9 mL

## 2019-06-26 MED ORDER — DEXAMETHASONE SODIUM PHOSPHATE 4 MG/ML IJ SOLN
INTRAMUSCULAR | Status: DC | PRN
  Administered 2019-06-26: 10 mg via INTRAVENOUS

## 2019-06-26 MED ORDER — KETOROLAC TROMETHAMINE 30 MG/ML IJ SOLN
INTRAMUSCULAR | Status: DC | PRN
  Administered 2019-06-26: 30 mg via INTRAVENOUS

## 2019-06-26 MED ORDER — PROMETHAZINE HCL 25 MG/ML IJ SOLN
6.2500 mg | INTRAMUSCULAR | Status: DC | PRN
  Filled 2019-06-26: qty 1

## 2019-06-26 MED ORDER — OXYCODONE HCL 5 MG PO TABS
5.0000 mg | ORAL_TABLET | Freq: Once | ORAL | Status: AC
  Administered 2019-06-26: 5 mg via ORAL
  Filled 2019-06-26: qty 1

## 2019-06-26 MED ORDER — LIDOCAINE 2% (20 MG/ML) 5 ML SYRINGE
INTRAMUSCULAR | Status: AC
  Filled 2019-06-26: qty 5

## 2019-06-26 MED ORDER — FENTANYL CITRATE (PF) 100 MCG/2ML IJ SOLN
25.0000 ug | INTRAMUSCULAR | Status: DC | PRN
  Administered 2019-06-26: 25 ug via INTRAVENOUS
  Filled 2019-06-26: qty 1

## 2019-06-26 MED ORDER — PROPOFOL 10 MG/ML IV BOLUS
INTRAVENOUS | Status: AC
  Filled 2019-06-26: qty 40

## 2019-06-26 MED ORDER — LIDOCAINE HCL (CARDIAC) PF 100 MG/5ML IV SOSY
PREFILLED_SYRINGE | INTRAVENOUS | Status: DC | PRN
  Administered 2019-06-26: 100 mg via INTRAVENOUS

## 2019-06-26 MED ORDER — MIDAZOLAM HCL 5 MG/5ML IJ SOLN
INTRAMUSCULAR | Status: DC | PRN
  Administered 2019-06-26: 2 mg via INTRAVENOUS

## 2019-06-26 MED ORDER — ACETAMINOPHEN 500 MG PO TABS
ORAL_TABLET | ORAL | Status: AC
  Filled 2019-06-26: qty 1

## 2019-06-26 MED ORDER — SODIUM CHLORIDE 0.9 % IR SOLN
Status: DC | PRN
  Administered 2019-06-26: 3000 mL

## 2019-06-26 MED ORDER — OXYCODONE-ACETAMINOPHEN 5-325 MG PO TABS: 1.0000 | ORAL_TABLET | ORAL | 0 refills | Status: AC | PRN

## 2019-06-26 MED ORDER — CELECOXIB 400 MG PO CAPS
400.0000 mg | ORAL_CAPSULE | Freq: Once | ORAL | Status: AC
  Administered 2019-06-26: 400 mg via ORAL
  Filled 2019-06-26: qty 1

## 2019-06-26 MED ORDER — FENTANYL CITRATE (PF) 100 MCG/2ML IJ SOLN
INTRAMUSCULAR | Status: AC
  Filled 2019-06-26: qty 2

## 2019-06-26 MED ORDER — DEXAMETHASONE SODIUM PHOSPHATE 10 MG/ML IJ SOLN
INTRAMUSCULAR | Status: AC
  Filled 2019-06-26: qty 1

## 2019-06-26 MED ORDER — SODIUM CHLORIDE 0.9 % IV SOLN
2.0000 g | INTRAVENOUS | Status: DC
  Filled 2019-06-26: qty 20

## 2019-06-26 MED ORDER — SODIUM CHLORIDE 0.9 % IV SOLN
INTRAVENOUS | Status: DC
  Filled 2019-06-26: qty 1000

## 2019-06-26 MED ORDER — CELECOXIB 200 MG PO CAPS
ORAL_CAPSULE | ORAL | Status: AC
  Filled 2019-06-26: qty 2

## 2019-06-26 MED ORDER — GENTAMICIN SULFATE 40 MG/ML IJ SOLN
5.0000 mg/kg | Freq: Once | INTRAVENOUS | Status: AC
  Administered 2019-06-26: 331.5 mg via INTRAVENOUS
  Filled 2019-06-26 (×2): qty 8.25

## 2019-06-26 MED ORDER — FENTANYL CITRATE (PF) 100 MCG/2ML IJ SOLN
INTRAMUSCULAR | Status: DC | PRN
  Administered 2019-06-26 (×2): 50 ug via INTRAVENOUS

## 2019-06-26 MED ORDER — KETOROLAC TROMETHAMINE 30 MG/ML IJ SOLN
INTRAMUSCULAR | Status: AC
  Filled 2019-06-26: qty 1

## 2019-06-26 MED ORDER — SODIUM CHLORIDE 0.9 % IV SOLN
INTRAVENOUS | Status: AC
  Filled 2019-06-26: qty 100

## 2019-06-26 SURGICAL SUPPLY — 34 items
BAG DRAIN URO-CYSTO SKYTR STRL (DRAIN) ×4 IMPLANT
BASKET STONE 1.7 NGAGE (UROLOGICAL SUPPLIES) ×4 IMPLANT
CATH INTERMIT  6FR 70CM (CATHETERS) ×4 IMPLANT
CLOTH BEACON ORANGE TIMEOUT ST (SAFETY) ×4 IMPLANT
EVACUATOR MICROVAS BLADDER (UROLOGICAL SUPPLIES) IMPLANT
EXTRACTOR STONE 1.7FRX115CM (UROLOGICAL SUPPLIES) IMPLANT
FIBER LASER FLEXIVA 1000 (UROLOGICAL SUPPLIES) IMPLANT
FIBER LASER FLEXIVA 365 (UROLOGICAL SUPPLIES) IMPLANT
FIBER LASER FLEXIVA 550 (UROLOGICAL SUPPLIES) IMPLANT
FIBER LASER TRAC TIP (UROLOGICAL SUPPLIES) IMPLANT
GLOVE BIO SURGEON STRL SZ 6.5 (GLOVE) ×3 IMPLANT
GLOVE BIO SURGEON STRL SZ7 (GLOVE) ×4 IMPLANT
GLOVE BIO SURGEON STRL SZ8 (GLOVE) ×4 IMPLANT
GLOVE BIO SURGEONS STRL SZ 6.5 (GLOVE) ×1
GLOVE BIOGEL PI IND STRL 6.5 (GLOVE) ×2 IMPLANT
GLOVE BIOGEL PI IND STRL 7.0 (GLOVE) ×2 IMPLANT
GLOVE BIOGEL PI INDICATOR 6.5 (GLOVE) ×2
GLOVE BIOGEL PI INDICATOR 7.0 (GLOVE) ×2
GOWN STRL REUS W/TWL LRG LVL3 (GOWN DISPOSABLE) ×8 IMPLANT
GOWN STRL REUS W/TWL XL LVL3 (GOWN DISPOSABLE) ×4 IMPLANT
GUIDEWIRE STR DUAL SENSOR (WIRE) ×4 IMPLANT
GUIDEWIRE ZIPWRE .038 STRAIGHT (WIRE) ×4 IMPLANT
IV NS 1000ML (IV SOLUTION)
IV NS 1000ML BAXH (IV SOLUTION) IMPLANT
IV NS IRRIG 3000ML ARTHROMATIC (IV SOLUTION) ×4 IMPLANT
KIT TURNOVER CYSTO (KITS) ×4 IMPLANT
MANIFOLD NEPTUNE II (INSTRUMENTS) ×4 IMPLANT
NS IRRIG 500ML POUR BTL (IV SOLUTION) ×4 IMPLANT
PACK CYSTO (CUSTOM PROCEDURE TRAY) ×4 IMPLANT
STENT URET 6FRX26 CONTOUR (STENTS) IMPLANT
SYR 10ML LL (SYRINGE) ×8 IMPLANT
TUBE CONNECTING 12'X1/4 (SUCTIONS)
TUBE CONNECTING 12X1/4 (SUCTIONS) IMPLANT
TUBING UROLOGY SET (TUBING) ×4 IMPLANT

## 2019-06-26 NOTE — Discharge Instructions (Signed)
Laser Therapy for Kidney Stones, Care After This sheet gives you information about how to care for yourself after your procedure. Your health care provider may also give you more specific instructions. If you have problems or questions, contact your health care provider. What can I expect after the procedure? After the procedure, it is common to have:  Pain.  A burning sensation while urinating.  Small amounts of blood in your urine.  A need to urinate frequently.  Pieces of kidney stone in your urine.  Mild discomfort when urinating that may be felt in the back. You may experience this if you have a flexible tube (stent) in your ureter. Follow these instructions at home:  Medicines  Take over-the-counter and prescription medicines only as told by your health care provider.  If you were prescribed an antibiotic medicine, take it as told by your health care provider. Do not stop taking the antibiotic even if you start to feel better.  Ask your health care provider if the medicine prescribed to you: ? Requires you to avoid driving or using heavy machinery. ? Can cause constipation. You may need to take actions to prevent or treat constipation, such as:  Take over-the-counter or prescription medicines.  Eat foods that are high in fiber, such as beans, whole grains, and fresh fruits and vegetables.  Limit foods that are high in fat and processed sugars, such as fried or sweet foods. Activity  Return to your normal activities as told by your health care provider. Ask your health care provider what activities are safe for you.  Do not drive for 24 hours if you were given a sedative during your procedure. General instructions  If your health care provider approves, you may take a warm bath to ease discomfort and burning.  Drink enough fluid to keep your urine pale yellow. Your health care provider may recommend drinking two 8 oz (237 mL) glasses of water per hour for a few hours  after your procedure.  You may be asked to strain your urine to collect any stone fragments that you pass. These fragments may be tested.  Keep all follow-up visits as told by your health care provider. This is important. If you have a stent, you will need to return to your health care provider to have the stent removed. Contact a health care provider if you:  Have pain or a burning feeling that lasts more than 2 days.  Feel nauseous.  Vomit more and more often.  Have difficulty urinating.  Have pain that gets worse or does not get better with medicine. Get help right away if:  You are unable to urinate, even if your bladder feels full.  You have: ? Bright red blood or blood clots in your urine. ? More blood in your urine. ? Severe pain or discomfort. ? A fever or shaking chills. ? Abdominal pain. ? Difficulty breathing. ? Swelling in your legs. Summary  After the procedure, it is common to have a burning sensation while urinating and small amounts of blood in your urine.  Take over-the-counter and prescription medicines only as told by your health care provider.  Drink enough fluid to keep your urine pale yellow.  Keep all follow-up visits as told by your health care provider. This is important. This information is not intended to replace advice given to you by your health care provider. Make sure you discuss any questions you have with your health care provider. Document Revised: 12/26/2017 Document Reviewed: 12/26/2017 Elsevier  Patient Education  [place] Instructions  Activity: Get plenty of rest for the remainder of the day. A responsible individual must stay with you for 24 hours following the procedure.  For the next 24 hours, DO NOT: -Drive a car -Paediatric nurse -Drink alcoholic beverages -Take any medication unless instructed by your physician -Make any legal decisions or sign important papers.  Meals: Start with  liquid foods such as gelatin or soup. Progress to regular foods as tolerated. Avoid greasy, spicy, heavy foods. If nausea and/or vomiting occur, drink only clear liquids until the nausea and/or vomiting subsides. Call your physician if vomiting continues.  Special Instructions/Symptoms: Your throat may feel dry or sore from the anesthesia or the breathing tube placed in your throat during surgery. If this causes discomfort, gargle with warm salt water. The discomfort should disappear within 24 hours.    Dietary Guidelines to Help Prevent Kidney Stones Kidney stones are deposits of minerals and salts that form inside your kidneys. Your risk of developing kidney stones may be greater depending on your diet, your lifestyle, the medicines you take, and whether you have certain medical conditions. Most people can reduce their chances of developing kidney stones by following the instructions below. Depending on your overall health and the type of kidney stones you tend to develop, your dietitian may give you more specific instructions. What are tips for following this plan? Reading food labels  Choose foods with "no salt added" or "low-salt" labels. Limit your sodium intake to less than 1500 mg per day.  Choose foods with calcium for each meal and snack. Try to eat about 300 mg of calcium at each meal. Foods that contain 200-500 mg of calcium per serving include: ? 8 oz (237 ml) of milk, fortified nondairy milk, and fortified fruit juice. ? 8 oz (237 ml) of kefir, yogurt, and soy yogurt. ? 4 oz (118 ml) of tofu. ? 1 oz of cheese. ? 1 cup (300 g) of dried figs. ? 1 cup (91 g) of cooked broccoli. ? 1-3 oz can of sardines or mackerel.  Most people need 1000 to 1500 mg of calcium each day. Talk to your dietitian about how much calcium is recommended for you. Shopping  Buy plenty of fresh fruits and vegetables. Most people do not need to avoid fruits and vegetables, even if they contain nutrients that  may contribute to kidney stones.  When shopping for convenience foods, choose: ? Whole pieces of fruit. ? Premade salads with dressing on the side. ? Low-fat fruit and yogurt smoothies.  Avoid buying frozen meals or prepared deli foods.  Look for foods with live cultures, such as yogurt and kefir. Cooking  Do not add salt to food when cooking. Place a salt shaker on the table and allow each person to add his or her own salt to taste.  Use vegetable protein, such as beans, textured vegetable protein (TVP), or tofu instead of meat in pasta, casseroles, and soups. Meal planning   Eat less salt, if told by your dietitian. To do this: ? Avoid eating processed or premade food. ? Avoid eating fast food.  Eat less animal protein, including cheese, meat, poultry, or fish, if told by your dietitian. To do this: ? Limit the number of times you have meat, poultry, fish, or cheese each week. Eat a diet free of meat at least 2 days a week. ? Eat only one serving each day of meat, poultry, fish,  or seafood. ? When you prepare animal protein, cut pieces into small portion sizes. For most meat and fish, one serving is about the size of one deck of cards.  Eat at least 5 servings of fresh fruits and vegetables each day. To do this: ? Keep fruits and vegetables on hand for snacks. ? Eat 1 piece of fruit or a handful of berries with breakfast. ? Have a salad and fruit at lunch. ? Have two kinds of vegetables at dinner.  Limit foods that are high in a substance called oxalate. These include: ? Spinach. ? Rhubarb. ? Beets. ? Potato chips and french fries. ? Nuts.  If you regularly take a diuretic medicine, make sure to eat at least 1-2 fruits or vegetables high in potassium each day. These include: ? Avocado. ? Banana. ? Orange, prune, carrot, or tomato juice. ? Baked potato. ? Cabbage. ? Beans and split peas. General instructions   Drink enough fluid to keep your urine clear or pale  yellow. This is the most important thing you can do.  Talk to your health care provider and dietitian about taking daily supplements. Depending on your health and the cause of your kidney stones, you may be advised: ? Not to take supplements with vitamin C. ? To take a calcium supplement. ? To take a daily probiotic supplement. ? To take other supplements such as magnesium, fish oil, or vitamin B6.  Take all medicines and supplements as told by your health care provider.  Limit alcohol intake to no more than 1 drink a day for nonpregnant women and 2 drinks a day for men. One drink equals 12 oz of beer, 5 oz of wine, or 1 oz of hard liquor.  Lose weight if told by your health care provider. Work with your dietitian to find strategies and an eating plan that works best for you. What foods are not recommended? Limit your intake of the following foods, or as told by your dietitian. Talk to your dietitian about specific foods you should avoid based on the type of kidney stones and your overall health. Grains Breads. Bagels. Rolls. Baked goods. Salted crackers. Cereal. Pasta. Vegetables Spinach. Rhubarb. Beets. Canned vegetables. Rosita Fire. Olives. Meats and other protein foods Nuts. Nut butters. Large portions of meat, poultry, or fish. Salted or cured meats. Deli meats. Hot dogs. Sausages. Dairy Cheese. Beverages Regular soft drinks. Regular vegetable juice. Seasonings and other foods Seasoning blends with salt. Salad dressings. Canned soups. Soy sauce. Ketchup. Barbecue sauce. Canned pasta sauce. Casseroles. Pizza. Lasagna. Frozen meals. Potato chips. [place] fries. Summary  You can reduce your risk of kidney stones by making changes to your diet.  The most important thing you can do is drink enough fluid. You should drink enough fluid to keep your urine clear or pale yellow.  Ask your health care provider or dietitian how much protein from animal sources you should eat each day, and also  how much salt and calcium you should have each day. This information is not intended to replace advice given to you by your health care provider. Make sure you discuss any questions you have with your health care provider. Document Revised: 08/06/2018 Document Reviewed: 03/27/2016 Elsevier Patient Education  2020 [employer].

## 2019-06-26 NOTE — Transfer of Care (Signed)
Immediate Anesthesia Transfer of Care Note  Patient: Yolanda Olsen  Procedure(s) Performed: Procedure(s) (LRB): CYSTOSCOPY WITH RETROGRADE PYELOGRAM, URETEROSCOPY AND STENT PLACEMENT (Bilateral)  Patient Location: PACU  Anesthesia Type: General  Level of Consciousness: awake, sedated, patient cooperative and responds to stimulation  Airway & Oxygen Therapy: Patient Spontanous Breathing and Patient connected to [place] 02 and soft FM   Post-op Assessment: Report given to PACU RN, Post -op Vital signs reviewed and stable and Patient moving all extremities  Post vital signs: Reviewed and stable  Complications: No apparent anesthesia complications

## 2019-06-26 NOTE — Anesthesia Procedure Notes (Signed)
Procedure Name: LMA Insertion Date/Time: 06/26/2019 7:56 AM Performed by: Jessica Priest, CRNA Pre-anesthesia Checklist: Patient identified, Emergency Drugs available, Suction available and Patient being monitored Patient Re-evaluated:Patient Re-evaluated prior to induction Oxygen Delivery Method: Circle system utilized Preoxygenation: Pre-oxygenation with 100% oxygen Induction Type: IV induction Ventilation: Mask ventilation without difficulty LMA: LMA inserted LMA Size: 4.0 Number of attempts: 1 Airway Equipment and Method: Bite block Placement Confirmation: positive ETCO2,  CO2 detector and breath sounds checked- equal and bilateral Tube secured with: Tape Dental Injury: Teeth and Oropharynx as per pre-operative assessment

## 2019-06-26 NOTE — Anesthesia Postprocedure Evaluation (Signed)
Anesthesia Post Note  Patient: Chief Financial Officer  Procedure(s) Performed: CYSTOSCOPY WITH RETROGRADE PYELOGRAM, URETEROSCOPY AND STENT PLACEMENT (Bilateral Ureter)     Patient location during evaluation: PACU Anesthesia Type: General Level of consciousness: sedated Pain management: pain level controlled Vital Signs Assessment: post-procedure vital signs reviewed and stable Respiratory status: spontaneous breathing and respiratory function stable Cardiovascular status: stable Postop Assessment: no apparent nausea or vomiting Anesthetic complications: no    Last Vitals:  Vitals:   06/26/19 0930 06/26/19 1013  BP: 117/71 126/75  Pulse: 64 65  Resp: (!) 9 12  Temp:  36.6 C  SpO2: 97% 93%    Last Pain:  Vitals:   06/26/19 1013  TempSrc:   PainSc: 5                  Cay Kath DANIEL

## 2019-06-26 NOTE — H&P (Signed)
Urology Admission H&P  Chief Complaint: bilateral ureteral calculi  History of Present Illness: Ms Schabel is a 63yo with bilateral ureteral calculi here for bilateral ureteroscopic stone extraction. She has bilateral flank pain with stents in place. seevre LUTS  Past Medical History:  Diagnosis Date  . Diabetes mellitus without complication (HCC)   . Hypertension    Past Surgical History:  Procedure Laterality Date  . COLONOSCOPY    . CYSTOSCOPY WITH RETROGRADE PYELOGRAM, URETEROSCOPY AND STENT PLACEMENT Bilateral 05/24/2019   Procedure: CYSTOSCOPY WITH RETROGRADE PYELOGRAM,  AND STENT PLACEMENT;  Surgeon: Malen Gauze, MD;  Location: [facility];  Service: Urology;  Laterality: Bilateral;    Home Medications:  Current Facility-Administered Medications  Medication Dose Route Frequency Provider Last Rate Last Admin  . 0.9 %  sodium chloride infusion   Intravenous Continuous Heather Roberts, MD 50 mL/hr at 06/26/19 0624 New Bag at 06/26/19 1914  . cefTRIAXone (ROCEPHIN) 2 g in sodium chloride 0.9 % 100 mL IVPB  2 g Intravenous 30 min Pre-Op Stancil Deisher, Mardene Celeste, MD      . lactated ringers infusion   Intravenous Continuous Kaylyn Layer, MD       Allergies:  Allergies  Allergen Reactions  . Ceftriaxone Sodium And Nacl Itching    History reviewed. No pertinent family history. Social History:  reports that she has never smoked. She has never used smokeless tobacco. She reports previous alcohol use. She reports that she does not use drugs.  Review of Systems  Genitourinary: Positive for dysuria and flank pain.  All other systems reviewed and are negative.   Physical Exam:  Vital signs in last 24 hours: Temp:  [97.4 F (36.3 C)] 97.4 F (36.3 C) (02/26 0608) Pulse Rate:  [78] 78 (02/26 0608) Resp:  [14] 14 (02/26 0608) BP: (123)/(76) 123/76 (02/26 0608) SpO2:  [97 %] 97 % (02/26 0608) Weight:  [66.3 kg] 66.3 kg (02/26 7829) Physical Exam  Constitutional: She is  oriented to person, place, and time. She appears well-developed and well-nourished.  HENT:  Head: Normocephalic and atraumatic.  Eyes: Pupils are equal, round, and reactive to light. EOM are normal.  Neck: No thyromegaly present.  Cardiovascular: Normal rate and regular rhythm.  Respiratory: Effort normal. No respiratory distress.  GI: Soft. She exhibits no distension.  Musculoskeletal:        General: No edema. Normal range of motion.     Cervical back: Normal range of motion.  Neurological: She is alert and oriented to person, place, and time.  Skin: Skin is warm and dry.  Psychiatric: She has a normal mood and affect. Her behavior is normal. Judgment and thought content normal.    Laboratory Data:  Results for orders placed or performed during the hospital encounter of 06/26/19 (from the past 24 hour(s))  Glucose, capillary     Status: Abnormal   Collection Time: 06/26/19  6:26 AM  Result Value Ref Range   Glucose-Capillary 141 (H) 70 - 99 mg/dL   Recent Results (from the past 240 hour(s))  SARS CORONAVIRUS 2 (TAT 6-24 HRS) Nasopharyngeal Nasopharyngeal Swab     Status: None   Collection Time: 06/23/19 10:06 AM   Specimen: Nasopharyngeal Swab  Result Value Ref Range Status   SARS Coronavirus 2 NEGATIVE NEGATIVE Final    Comment: (NOTE) SARS-CoV-2 target nucleic acids are NOT DETECTED. The SARS-CoV-2 RNA is generally detectable in upper and lower respiratory specimens during the acute phase of infection. Negative results do not preclude SARS-CoV-2 infection,  do not rule out co-infections with other pathogens, and should not be used as the sole basis for treatment or other patient management decisions. Negative results must be combined with clinical observations, patient history, and epidemiological information. The expected result is Negative. Fact Sheet for Patients: SugarRoll.be Fact Sheet for Healthcare  Providers: https://www.woods-mathews.com/ This test is not yet approved or cleared by the [place] FDA and  has been authorized for detection and/or diagnosis of SARS-CoV-2 by FDA under an Emergency Use Authorization (EUA). This EUA will remain  in effect (meaning this test can be used) for the duration of the COVID-19 declaration under Section 56 4(b)(1) of the Act, 21 U.S.C. section 360bbb-3(b)(1), unless the authorization is terminated or revoked sooner. Performed at [facility], [place] [address], [place], [place] [address]    Creatinine: Recent Labs    06/24/19 9147  CREATININE 0.83   Baseline Creatinine: 0.8  Impression/Assessment:  63yo with bilateral ureteral calculi  Plan:  The risks/benefits/alterantives to bilateral ureteroscopic stone extraction was explained to the patient and she understands and wishes to proceed with surgery  Nicolette Bang 06/26/2019, 7:42 AM

## 2019-06-26 NOTE — Op Note (Signed)
.  Preoperative diagnosis: bilateral ureteral stone  Postoperative diagnosis: Same  Procedure: 1 cystoscopy 2. bilateralretrograde pyelography 3.  Intraoperative fluoroscopy, under one hour, with interpretation 4.  Bilateral ureteroscopic stone manipulation withbasket extraction   Attending: Cleda Mccreedy  Anesthesia: General  Estimated blood loss: None  Drains: none  Specimens: bilateral ureteral calculi  Antibiotics: gentamicin  Findings: bilateral distal ureteral stones. No hydronephrosis. No masses/lesions in the bladder. Ureteral orifices in normal anatomic location.  Indications: Patient is a 64 year old emale with a history of bilateral distal ureteral stone who underwent stent placement. After discussing treatment options, they decided proceed with bilateral ureteroscopic stone manipulation.  Procedure her in detail: The patient was brought to the operating room and a brief timeout was done to ensure correct patient, correct procedure, correct site.  General anesthesia was administered patient was placed in dorsal lithotomy position.  Her genitalia was then prepped and draped in usual sterile fashion.  A rigid 22 French cystoscope was passed in the urethra and the bladder.  Bladder was inspected free masses or lesions.  the ureteral orifices were in the normal orthotopic locations. Using a grasper the ureteral stent was brought to the urethral meatus. Through the stent a zipwire was advanced up to the renal pelvis. The stent was then removed. a 6 french ureteral catheter was then instilled into the left ureteral orifice.  a gentle retrograde was obtained and findings noted above.  we then removed the cystoscope and cannulated the left ureteral orifice with a semirigid ureteroscope.  We located the stone in the distal ureter and removed it with an Ngage basket.  We elected not to leave a stent since this was an uncomplicated ureteroscopy.  We then turned out attention to the right  side. Using a grasper the ureteral stent was brought to the urethral meatus. Through the stent a zipwire was advanced up to the renal pelvis. The stent was then removed. a 6 french ureteral catheter was then instilled into the right ureteral orifice.  a gentle retrograde was obtained and findings noted above.  we then removed the cystoscope and cannulated the left ureteral orifice with a semirigid ureteroscope.  We located the multiple small calculi in the distal ureter. the calculi were then removed with a engage basket.   We elected not to leave a stent since this was an uncomplicated ureteroscopy.   the bladder was then drained and this concluded the procedure which was well tolerated by patient.  Complications: None  Condition: Stable, extubated, transferred to PACU  Plan: Patient is to be discharged home as to follow-up in 2 weeks

## 2021-08-30 IMAGING — CT CT ABD-PELV W/ CM
2 of 5 series · 16 of 46 positions shown, 18 images · IV contrast (Omni 300)
Comparison: None.

CLINICAL DATA: Left lower quadrant pain for 6 days. Elevated white
blood cell count.

EXAM:
CT ABDOMEN AND PELVIS WITH CONTRAST
TECHNIQUE: Multidetector CT imaging of the abdomen and pelvis was performed
using the standard protocol following bolus administration of
intravenous contrast.
CONTRAST:  100 mL OMNIPAQUE IOHEXOL 300 MG/ML  SOLN

[Series 3: a/p w/ 5mm · axial · 0.71mm/px · z∈[-396,-11]mm · 13 of 87 slices shown, 15 images]
[im 5/87  soft-tissue]
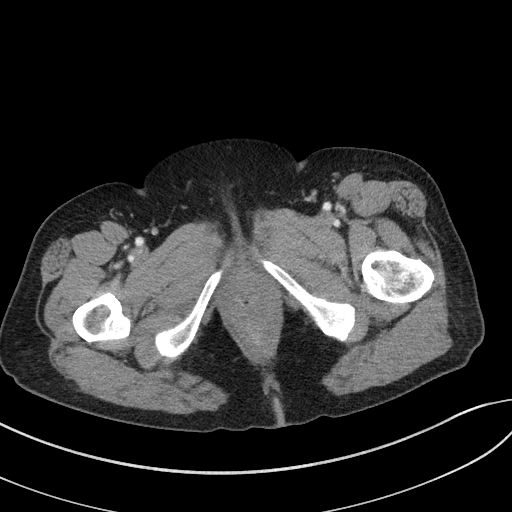
[im 5/87  bone]
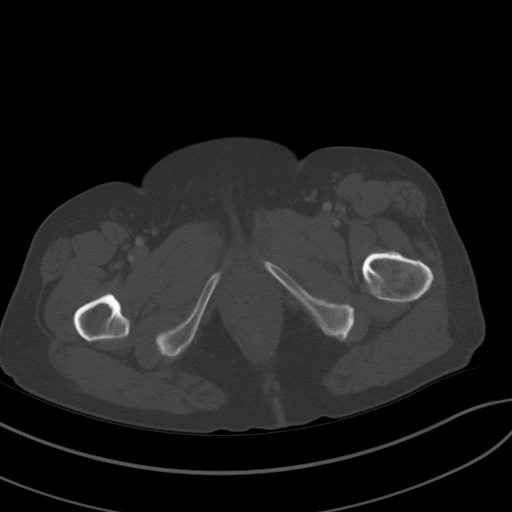
[im 10/87  soft-tissue]
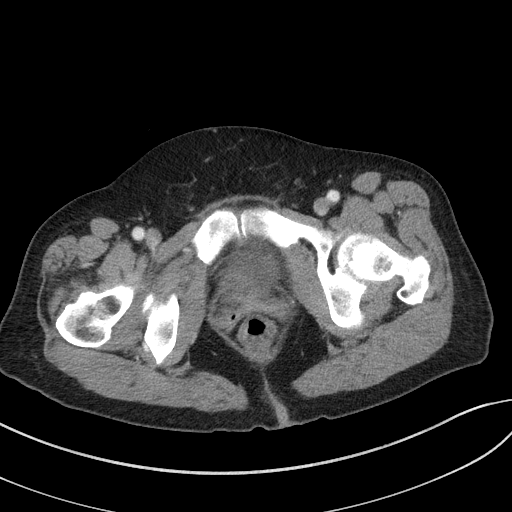
[im 20/87  soft-tissue]
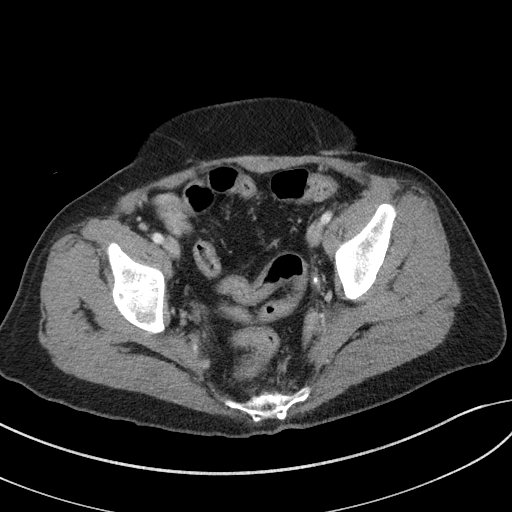
[im 24/87  soft-tissue]
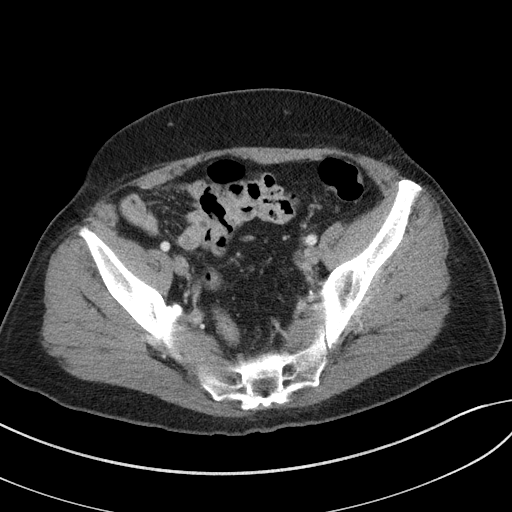
[im 29/87  soft-tissue]
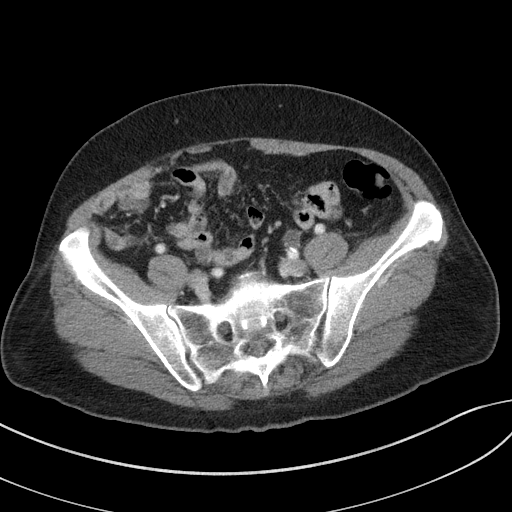
[im 39/87  soft-tissue]
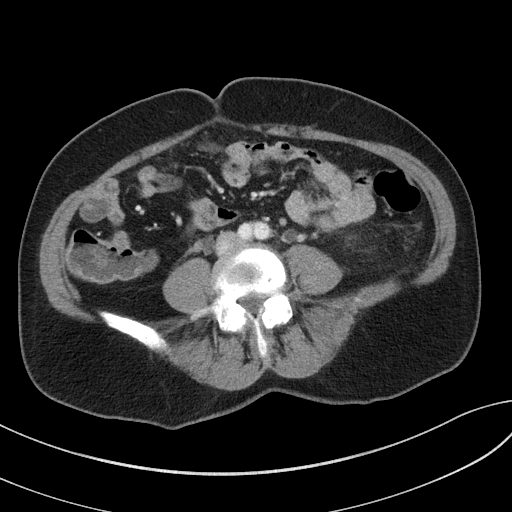
[im 44/87  soft-tissue]
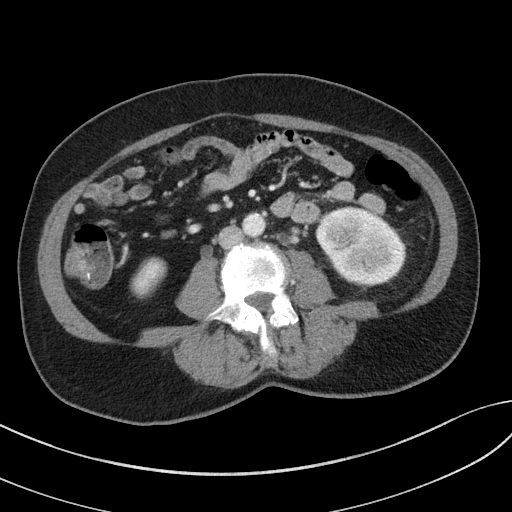
[im 48/87  soft-tissue]
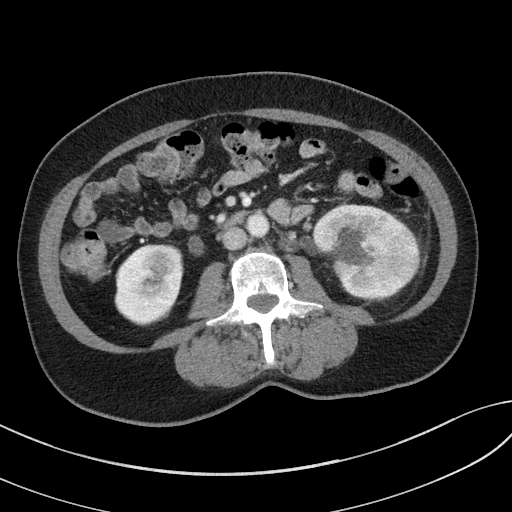
[im 58/87  soft-tissue]
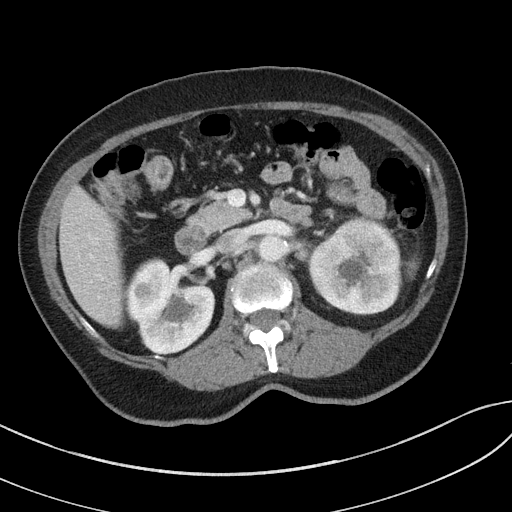
[im 58/87  bone]
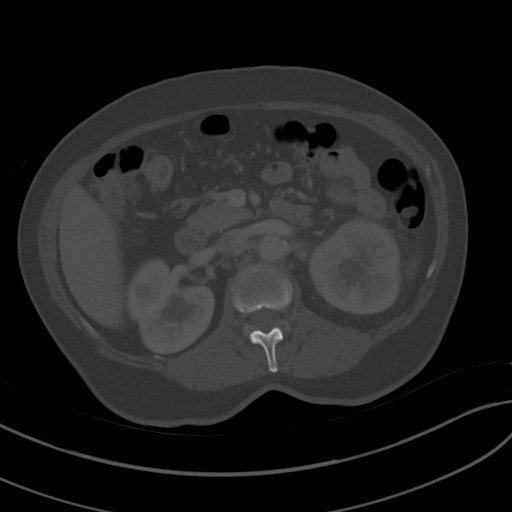
[im 63/87  soft-tissue]
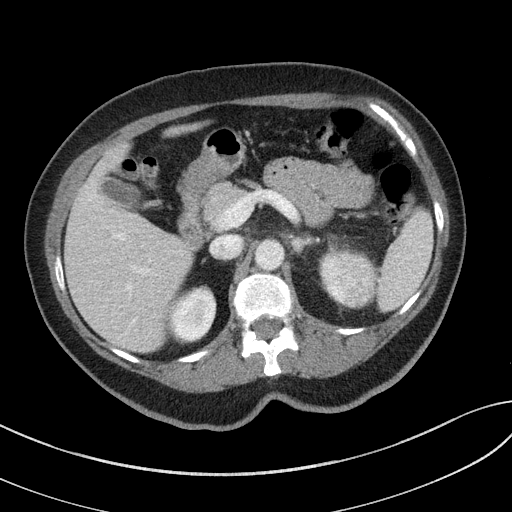
[im 67/87  soft-tissue]
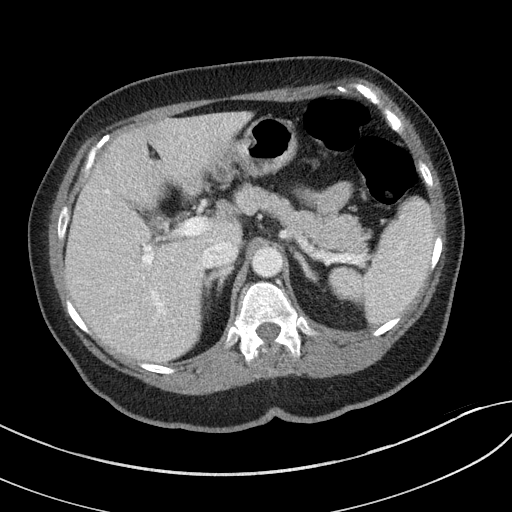
[im 77/87  soft-tissue]
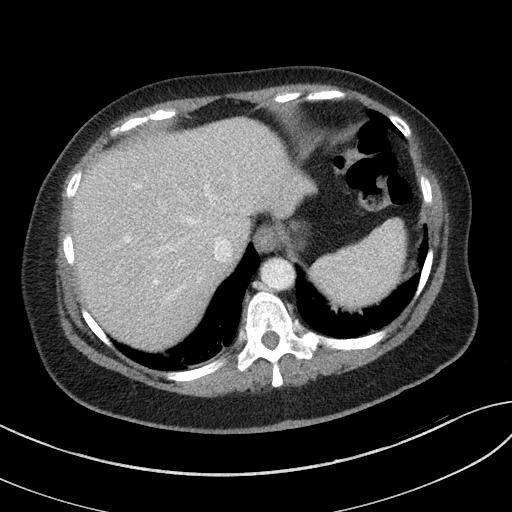
[im 82/87  soft-tissue]
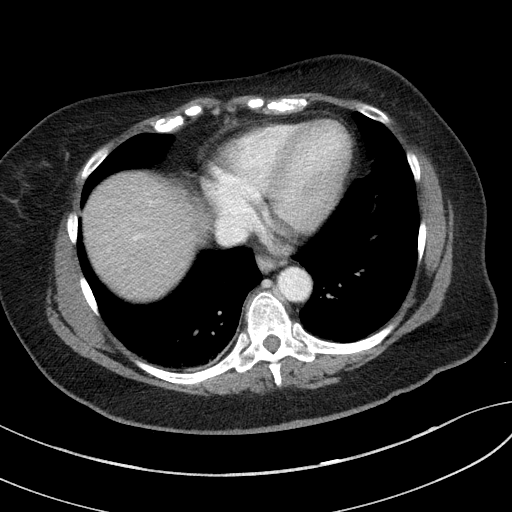

[Series 6: a/p w/ cor · coronal · 0.69mm/px · 3 of 147 slices shown]
[im 49/147  soft-tissue]
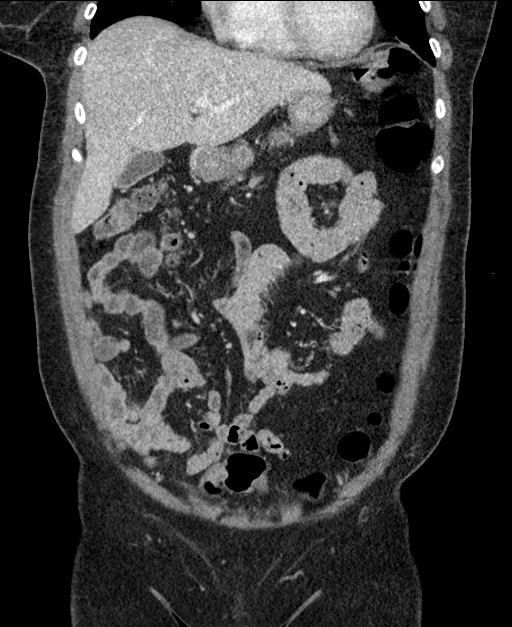
[im 65/147  soft-tissue]
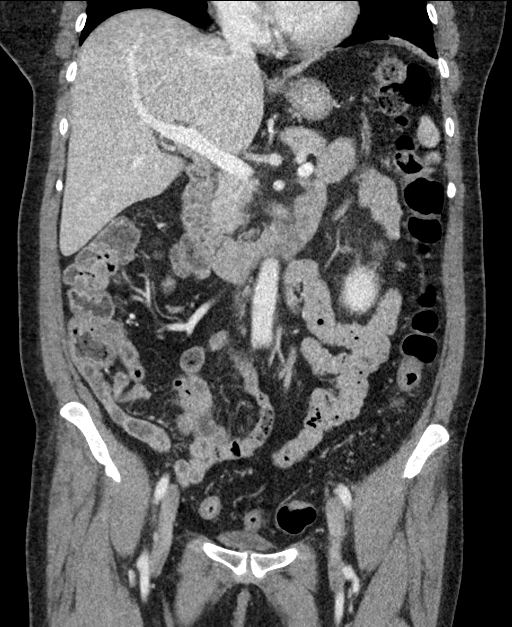
[im 82/147  soft-tissue]
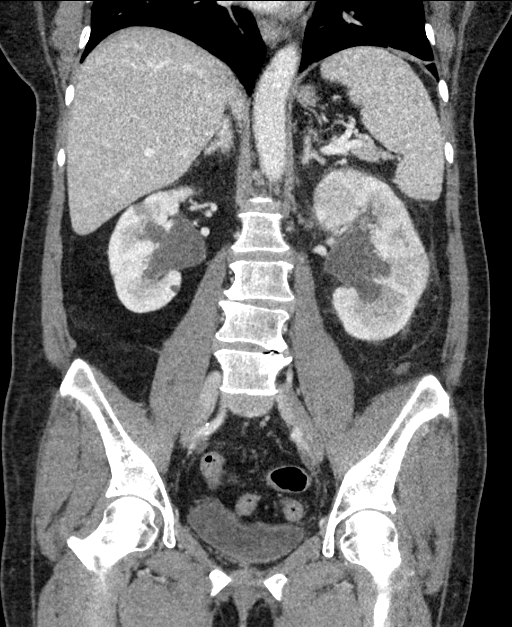

[16 of 46 positions shown; findings below may reference images not displayed]

FINDINGS: Lower chest: Mild dependent atelectasis. No pleural or pericardial
effusion.

Hepatobiliary: No focal liver abnormality is seen. No gallstones,
gallbladder wall thickening, or biliary dilatation.

Pancreas: Unremarkable. No pancreatic ductal dilatation or
surrounding inflammatory changes.

Spleen: Normal in size without focal abnormality.

Adrenals/Urinary Tract: The adrenal glands appear normal.

The patient has moderate to moderately severe bilateral
hydronephrosis which is worse on the left. There is a distal right
ureteral stone measuring 0.4 cm on image 66 and a distal left
ureteral stone on image 68 which measures 0.2 cm. Patchy areas
decreased cortical enhancement on delayed imaging are seen in both
kidneys, much more extensive on the left, compatible with
pyelonephritis. The patient has 2 small right and a single small
left renal cyst. Urinary bladder is unremarkable.

Stomach/Bowel: Stomach is within normal limits. Appendix appears
normal. No evidence of bowel wall thickening, distention, or
inflammatory changes.

Vascular/Lymphatic: Aortic atherosclerosis. No enlarged abdominal or
pelvic lymph nodes.

Reproductive: Status post hysterectomy. No adnexal masses.

Other: None.

Musculoskeletal: No acute or focal bony abnormality. Lower lumbar
degenerative change noted.
IMPRESSION: Moderately to moderately severe bilateral hydronephrosis due to
distal ureteral stones is worse on the left. Left ureteral stone
measures 0.2 cm and right renal stone measures 0.4 cm.

Multifocal areas of decreased cortical enhancement the kidneys
compatible with pyelonephritis are much more extensive in the left
kidney. No abscess.

Atherosclerosis.
